# Patient Record
Sex: Female | Born: 1990
Health system: Southern US, Community
[De-identification: ages and names within clinical notes are randomized; demographics above are authoritative.]

## PROBLEM LIST (undated history)

## (undated) ENCOUNTER — Inpatient Hospital Stay (HOSPITAL_COMMUNITY): Payer: Self-pay

## (undated) DIAGNOSIS — E039 Hypothyroidism, unspecified: Secondary | ICD-10-CM

## (undated) DIAGNOSIS — M2241 Chondromalacia patellae, right knee: Secondary | ICD-10-CM

## (undated) DIAGNOSIS — K219 Gastro-esophageal reflux disease without esophagitis: Secondary | ICD-10-CM

## (undated) DIAGNOSIS — M79A29 Nontraumatic compartment syndrome of unspecified lower extremity: Secondary | ICD-10-CM

## (undated) DIAGNOSIS — G43909 Migraine, unspecified, not intractable, without status migrainosus: Secondary | ICD-10-CM

## (undated) DIAGNOSIS — N854 Malposition of uterus: Secondary | ICD-10-CM

## (undated) HISTORY — DX: Migraine, unspecified, not intractable, without status migrainosus: G43.909

## (undated) HISTORY — DX: Hypothyroidism, unspecified: E03.9

## (undated) HISTORY — DX: Malposition of uterus: N85.4

## (undated) HISTORY — PX: KNEE SURGERY: SHX244

## (undated) HISTORY — DX: Gastro-esophageal reflux disease without esophagitis: K21.9

---
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
## 2012-02-08 ENCOUNTER — Other Ambulatory Visit: Payer: Self-pay | Admitting: Family

## 2012-11-12 ENCOUNTER — Ambulatory Visit (INDEPENDENT_AMBULATORY_CARE_PROVIDER_SITE_OTHER): Payer: 59 | Admitting: Family Medicine

## 2012-11-12 VITALS — BP 115/75 | HR 66 | Temp 98.0°F | Resp 18 | Wt 161.0 lb

## 2012-11-12 DIAGNOSIS — G43909 Migraine, unspecified, not intractable, without status migrainosus: Secondary | ICD-10-CM

## 2012-11-12 DIAGNOSIS — R200 Anesthesia of skin: Secondary | ICD-10-CM

## 2012-11-12 DIAGNOSIS — R209 Unspecified disturbances of skin sensation: Secondary | ICD-10-CM

## 2012-11-12 DIAGNOSIS — M7989 Other specified soft tissue disorders: Secondary | ICD-10-CM

## 2012-11-12 LAB — POCT CBC
Hemoglobin: 15.8 g/dL (ref 12.2–16.2)
Lymph, poc: 1.7 (ref 0.6–3.4)
MCH, POC: 30.9 pg (ref 27–31.2)
MCHC: 32.7 g/dL (ref 31.8–35.4)
MID (cbc): 0.5 (ref 0–0.9)
MPV: 9.4 fL (ref 0–99.8)
POC LYMPH PERCENT: 21.7 %L (ref 10–50)
POC MID %: 6.6 %M (ref 0–12)
Platelet Count, POC: 297 10*3/uL (ref 142–424)
WBC: 7.8 10*3/uL (ref 4.6–10.2)

## 2012-11-12 MED ORDER — KETOROLAC TROMETHAMINE 60 MG/2ML IM SOLN
60.0000 mg | Freq: Once | INTRAMUSCULAR | Status: AC
  Administered 2012-11-12: 60 mg via INTRAMUSCULAR

## 2012-11-12 NOTE — Patient Instructions (Addendum)
You should receive a call or letter about your lab results within the next week to 10 days.  Continue same migraine meds, but we will refer you to another neurologist for 2nd opinion on medicine for migraine and headaches as well as to discuss the tingling in your hands.  Return to the clinic or go to the nearest emergency room if any of your symptoms worsen or new symptoms occur. If hand swelling returns - return to clinic for recheck.   Migraine Headache A migraine headache is an intense, throbbing pain on one or both sides of your head. A migraine can last for 30 minutes to several hours. CAUSES  The exact cause of a migraine headache is not always known. However, a migraine may be caused when nerves in the brain become irritated and release chemicals that cause inflammation. This causes pain. SYMPTOMS  Pain on one or both sides of your head.  Pulsating or throbbing pain.  Severe pain that prevents daily activities.  Pain that is aggravated by any physical activity.  Nausea, vomiting, or both.  Dizziness.  Pain with exposure to bright lights, loud noises, or activity.  General sensitivity to bright lights, loud noises, or smells. Before you get a migraine, you may get warning signs that a migraine is coming (aura). An aura may include:  Seeing flashing lights.  Seeing bright spots, halos, or zig-zag lines.  Having tunnel vision or blurred vision.  Having feelings of numbness or tingling.  Having trouble talking.  Having muscle weakness. MIGRAINE TRIGGERS  Alcohol.  Smoking.  Stress.  Menstruation.  Aged cheeses.  Foods or drinks that contain nitrates, glutamate, aspartame, or tyramine.  Lack of sleep.  Chocolate.  Caffeine.  Hunger.  Physical exertion.  Fatigue.  Medicines used to treat chest pain (nitroglycerine), birth control pills, estrogen, and some blood pressure medicines. DIAGNOSIS  A migraine headache is often diagnosed based  on:  Symptoms.  Physical examination.  A CT scan or MRI of your head. TREATMENT Medicines may be given for pain and nausea. Medicines can also be given to help prevent recurrent migraines.  HOME CARE INSTRUCTIONS  Only take over-the-counter or prescription medicines for pain or discomfort as directed by your caregiver. The use of long-term narcotics is not recommended.  Lie down in a dark, quiet room when you have a migraine.  Keep a journal to find out what may trigger your migraine headaches. For example, write down:  What you eat and drink.  How much sleep you get.  Any change to your diet or medicines.  Limit alcohol consumption.  Quit smoking if you smoke.  Get 7 to 9 hours of sleep, or as recommended by your caregiver.  Limit stress.  Keep lights dim if bright lights bother you and make your migraines worse. SEEK IMMEDIATE MEDICAL CARE IF:   Your migraine becomes severe.  You have a fever.  You have a stiff neck.  You have vision loss.  You have muscular weakness or loss of muscle control.  You start losing your balance or have trouble walking.  You feel faint or pass out.  You have severe symptoms that are different from your first symptoms. MAKE SURE YOU:   Understand these instructions.  Will watch your condition.  Will get help right away if you are not doing well or get worse. Document Released: 12/26/2004 Document Revised: 03/20/2011 Document Reviewed: 12/16/2010 [facility] Patient Information 2014 [place], [place].

## 2012-11-12 NOTE — Progress Notes (Addendum)
Subjective:  This chart was scribed for Jackie Staggers, MD by Jackie [name], ED scribe.  This patient was seen in room Room 12 and the patient's care was started at 2:45 PM.   Patient ID: Jackie [name], female    DOB: 02/16/1990, 22 y.o.   MRN: [id number]  Chief Complaint  Patient presents with  . Migraine  . hand swelling  . Dizziness    HPI  Jackie [name] is a 22 y.o. female PCP: Jackie Found, MD Neurologist: [facility] Neale [name] at [facility]  Pt with h/o migraines presents with a persistent migraine headache onset 2 days ago.  Headache is associated with nausea and vomiting.  She states it is localized to the left side of her head and the back right whereas it is normally "the opposite."  It is otherwise typical of her past migraines except that it has not been relieved by Flexeril and Phenergan which she normally takes whenever she has a migraine.  She took one 25mg  Phenergan tablet last night but has been nauseated and vomited 2 times today.  She denies visual changes, photophobia, or fever.  Pt has had persistent headaches successfully treated with Toradol in the past.  Pt also takes Topamax 100mg  daily and complains of associated side effects.  She states that she was taking Imipramine 400mg  q.d. which was providing relief but she then began having breakthrough headaches and was switched to Topamax 5 months ago.  Since then she states she has had dizziness and numbness to her face and toes.  She states she has told Dr. Neale [name] but he has continued to increase her Topamax dosage.  She has not been seen for her migraines at any other neurology or headache clinic.  She also complains of left hand swelling which began today around 12 PM and has since resolved.  She denies injury or holding her hand in an unusual position.  She denies swelling to any other area.  . Pt reports a h/o anxiety which she states has been worse since she was placed on Topamax.  She has never  been on anxiety medication.  She also admits to remote h/o panic attacks "when I was younger if I got claustrophobic."  She denies any more recent panic attacks.     Patient Active Problem List   Diagnosis Date Noted  . ABDOMINAL PAIN-RUQ 12/23/2009   Past Medical History  Diagnosis Date  . Migraines    Past Surgical History  Procedure Laterality Date  . Left knee surgery     No Known Allergies Prior to Admission medications   Medication Sig Start Date End Date Taking? Authorizing Provider  cyclobenzaprine (FLEXERIL) 10 MG tablet Take 10 mg by mouth 3 (three) times daily as needed for muscle spasms.   Yes Historical Provider, MD  promethazine (PHENERGAN) 25 MG tablet Take 25 mg by mouth every 6 (six) hours as needed for nausea or vomiting.   Yes Historical Provider, MD  topiramate (TOPAMAX) 100 MG tablet Take 100 mg by mouth 2 (two) times daily.   Yes Historical Provider, MD   History   Social History  . Marital Status: Single    Spouse Name: N/A    Number of Children: N/A  . Years of Education: N/A   Occupational History  . Not on file.   Social History Main Topics  . Smoking status: Never Smoker   . Smokeless tobacco: Not on file  . Alcohol Use: No  . Drug Use: No  .  Sexual Activity: Not on file   Other Topics Concern  . Not on file   Social History Narrative  . No narrative on file     Review of Systems  Constitutional: Negative for fever.  Eyes: Negative for photophobia and visual disturbance.  Gastrointestinal: Positive for nausea and vomiting.  Musculoskeletal:       Left hand swelling  Neurological: Positive for dizziness, numbness and headaches.       Objective:   Physical Exam  Nursing note and vitals reviewed. Constitutional: She is oriented to person, place, and time. She appears well-developed and well-nourished. No distress.  HENT:  Head: Normocephalic and atraumatic.  Right Ear: Tympanic membrane and ear canal normal.  Left Ear: Tympanic  membrane and ear canal normal.  Mouth/Throat: Oropharynx is clear and moist. No oropharyngeal exudate.  Eyes: Conjunctivae and EOM are normal. Pupils are equal, round, and reactive to light. Right eye exhibits no nystagmus. Left eye exhibits no nystagmus.  Neck: Normal range of motion. Neck supple. No tracheal deviation present.  No apparent thyroid nodules  Cardiovascular: Normal rate, regular rhythm and normal heart sounds.   No murmur heard. Pulmonary/Chest: Effort normal and breath sounds normal. No respiratory distress. She has no wheezes. She has no rales.  Musculoskeletal: Normal range of motion.  Lymphadenopathy:    She has no cervical adenopathy.  Neurological: She is alert and oriented to person, place, and time. She displays a negative Romberg sign. Coordination and gait normal.  Heel-to-toe normal Finger-nose-finger normal bilaterally  Skin: Skin is warm and dry.  Psychiatric: She has a normal mood and affect. Her behavior is normal.  nonfocal exam.  Appears overall comfortable in office.    Filed Vitals:   11/12/12 1250  BP: 115/75  Pulse: 66  Temp: 98 F (36.7 C)  TempSrc: Oral  Resp: 18  Weight: 161 lb (73.029 kg)  SpO2: 99%   Results for orders placed in visit on 11/12/12  POCT CBC      Result Value Range   WBC 7.8  4.6 - 10.2 K/uL   Lymph, poc 1.7  0.6 - 3.4   POC LYMPH PERCENT 21.7  10 - 50 %L   MID (cbc) 0.5  0 - 0.9   POC MID % 6.6  0 - 12 %M   POC Granulocyte 5.6  2 - 6.9   Granulocyte percent 71.7  37 - 80 %G   RBC 5.11  4.04 - 5.48 M/uL   Hemoglobin 15.8  12.2 - 16.2 g/dL   HCT, POC 16.1 (*) 09.6 - 47.9 %   MCV 94.5  80 - 97 fL   MCH, POC 30.9  27 - 31.2 pg   MCHC 32.7  31.8 - 35.4 g/dL   RDW, POC 04.5     Platelet Count, POC 297  142 - 424 K/uL   MPV 9.4  0 - 99.8 fL   Toradol 60mg  IM given in office.     Assessment & Plan:   Jackie [name] is a 22 y.o. female Numbness and tingling - Plan: POCT CBC, TSH, Vitamin B12, Ambulatory referral  to Neurology  Migraine headache - Plan: POCT CBC, TSH, Vitamin B12, ketorolac (TORADOL) injection 60 mg, Ambulatory referral to Neurology  Hand swelling - Plan: POCT CBC, TSH, Vitamin B12  Headache - typical of migraines with exception that on opposite side of head, and not relieved with phenergan and flexeril.  nonfocal exam. Discussed change in maintenance med as possible numbness/tingling side effects  from Topamax (mentioned SSRI as possible underlying anxiety), but ultimately decided on 2nd opinion with [facility] to discuss meds.  Will check TSH, B12 level to R/O secondary cause of dysesthesias (FH of thyroid disease in mom). If any increase/change/worseing of HA or dysesthesias prior to Neuro eval - rtc or ER. TOradol given in office, continue phenergan and flexeril - can start this afternoon and note given for work.   L hand swelling - earlier today for few hours.  NKI or inciting event, but not swollen at time of evaluation.  rtc or ER if any recurrence, but does not appear to be allergic reaction or angioedema at this point.    Meds ordered this encounter  Medications  . topiramate (TOPAMAX) 100 MG tablet    Sig: Take 100 mg by mouth 2 (two) times daily.  . cyclobenzaprine (FLEXERIL) 10 MG tablet    Sig: Take 10 mg by mouth 3 (three) times daily as needed for muscle spasms.  . promethazine (PHENERGAN) 25 MG tablet    Sig: Take 25 mg by mouth every 6 (six) hours as needed for nausea or vomiting.  Marland Kitchen ketorolac (TORADOL) injection 60 mg    Sig:    Patient Instructions  You should receive a call or letter about your lab results within the next week to 10 days.  Continue same migraine meds, but we will refer you to another neurologist for 2nd opinion on medicine for migraine and headaches as well as to discuss the tingling in your hands.  Return to the clinic or go to the nearest emergency room if any of your symptoms worsen or new symptoms occur. If hand swelling returns -  return to clinic for recheck.   Migraine Headache A migraine headache is an intense, throbbing pain on one or both sides of your head. A migraine can last for 30 minutes to several hours. CAUSES  The exact cause of a migraine headache is not always known. However, a migraine may be caused when nerves in the brain become irritated and release chemicals that cause inflammation. This causes pain. SYMPTOMS  Pain on one or both sides of your head.  Pulsating or throbbing pain.  Severe pain that prevents daily activities.  Pain that is aggravated by any physical activity.  Nausea, vomiting, or both.  Dizziness.  Pain with exposure to bright lights, loud noises, or activity.  General sensitivity to bright lights, loud noises, or smells. Before you get a migraine, you may get warning signs that a migraine is coming (aura). An aura may include:  Seeing flashing lights.  Seeing bright spots, halos, or zig-zag lines.  Having tunnel vision or blurred vision.  Having feelings of numbness or tingling.  Having trouble talking.  Having muscle weakness. MIGRAINE TRIGGERS  Alcohol.  Smoking.  Stress.  Menstruation.  Aged cheeses.  Foods or drinks that contain nitrates, glutamate, aspartame, or tyramine.  Lack of sleep.  Chocolate.  Caffeine.  Hunger.  Physical exertion.  Fatigue.  Medicines used to treat chest pain (nitroglycerine), birth control pills, estrogen, and some blood pressure medicines. DIAGNOSIS  A migraine headache is often diagnosed based on:  Symptoms.  Physical examination.  A CT scan or MRI of your head. TREATMENT Medicines may be given for pain and nausea. Medicines can also be given to help prevent recurrent migraines.  HOME CARE INSTRUCTIONS  Only take over-the-counter or prescription medicines for pain or discomfort as directed by your caregiver. The use of long-term narcotics is not recommended.  Lie down in a dark, quiet room when you  have a migraine.  Keep a journal to find out what may trigger your migraine headaches. For example, write down:  What you eat and drink.  How much sleep you get.  Any change to your diet or medicines.  Limit alcohol consumption.  Quit smoking if you smoke.  Get 7 to 9 hours of sleep, or as recommended by your caregiver.  Limit stress.  Keep lights dim if bright lights bother you and make your migraines worse. SEEK IMMEDIATE MEDICAL CARE IF:   Your migraine becomes severe.  You have a fever.  You have a stiff neck.  You have vision loss.  You have muscular weakness or loss of muscle control.  You start losing your balance or have trouble walking.  You feel faint or pass out.  You have severe symptoms that are different from your first symptoms. MAKE SURE YOU:   Understand these instructions.  Will watch your condition.  Will get help right away if you are not doing well or get worse. Document Released: 12/26/2004 Document Revised: 03/20/2011 Document Reviewed: 12/16/2010 [facility] Patient Information 2014 [place], [place].     I personally performed the services described in this documentation, which was scribed in my presence. The recorded information has been reviewed and considered, and addended by me as needed.

## 2012-11-14 ENCOUNTER — Encounter: Payer: Self-pay | Admitting: Neurology

## 2012-11-14 ENCOUNTER — Ambulatory Visit (INDEPENDENT_AMBULATORY_CARE_PROVIDER_SITE_OTHER): Payer: 59 | Admitting: Neurology

## 2012-11-14 VITALS — BP 106/68 | HR 82 | Ht 65.25 in | Wt 157.0 lb

## 2012-11-14 DIAGNOSIS — G43909 Migraine, unspecified, not intractable, without status migrainosus: Secondary | ICD-10-CM

## 2012-11-14 MED ORDER — ZONISAMIDE 50 MG PO CAPS: 50.0000 mg | ORAL_CAPSULE | Freq: Two times a day (BID) | ORAL | Status: DC

## 2012-11-14 MED ORDER — RIZATRIPTAN BENZOATE 5 MG PO TBDP: 5.0000 mg | ORAL_TABLET | ORAL | Status: DC | PRN

## 2012-11-14 NOTE — Progress Notes (Signed)
[facility]  PATIENT: Jackie [name] DOB: 14-Jan-1990  HISTORICAL  [name] is a 22 years old right-handed Caucasian female, she is referred by her primary care physician Dr. Merri Brunette for evaluation of migraine headaches  She had long-standing history of migraines, since age 22, has seen different treatment in the past, including [facility], Dr. Neale Burly, she was taking imipramine for 3 years, initially it was helping her, but later it become ineffective,  About 3 months ago, July 28, 2012, she was put on Topamax, she is currently taking 100 mg every day, she did not see significant improvement of her headaches, complains the side effect of numbness tingling of her face, and in her extremities.   July 2014, she has typical migraines about couple times in a month, now she has almost daily headaches, she complains of pressure, dull, achy pain at her left frontal, occipital, parietal region, daily basis, she has been taking Flexeril, Phenergan as needed for headaches.  She denies visual loss, she denies dysarthria, she denies lateralized motor or sensory deficit   REVIEW OF SYSTEMS: Full 14 system review of systems performed and notable only for blurred vision, eye pain, headaches, numbness, dizziness  ALLERGIES: No Known Allergies  HOME MEDICATIONS: Outpatient Prescriptions Prior to Visit  Medication Sig Dispense Refill  . cyclobenzaprine (FLEXERIL) 10 MG tablet Take 10 mg by mouth 3 (three) times daily as needed for muscle spasms.      . promethazine (PHENERGAN) 25 MG tablet Take 25 mg by mouth every 6 (six) hours as needed for nausea or vomiting.      . topiramate (TOPAMAX) 100 MG tablet Take 100 mg by mouth 2 (two) times daily.       PAST MEDICAL HISTORY: Past Medical History  Diagnosis Date  . Migraines     PAST SURGICAL HISTORY: Past Surgical History  Procedure Laterality Date  . Left knee surgery      FAMILY HISTORY: Family History    Problem Relation Age of Onset  . Thyroid disease Mother   . Diabetes Paternal Grandmother     SOCIAL HISTORY:  History   Social History  . Marital Status: Single    Spouse Name: N/A    Number of Children: 0  . Years of Education: 14   Occupational History  . MINOR    Social History Main Topics  . Smoking status: Never Smoker   . Smokeless tobacco: Never Used  . Alcohol Use: No  . Drug Use: No  . Sexual Activity: Not on file   Other Topics Concern  . Not on file   Social History Narrative   Patient is single and lives with a roommate.   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   Patient has a college education.   Patient is right-handed.   Patient drinks very little caffeine.     PHYSICAL EXAM   Filed Vitals:   11/14/12 0854  BP: 106/68  Pulse: 82  Height: 5' 5.25" (1.657 m)  Weight: 157 lb (71.215 kg)   Body mass index is 25.94 kg/(m^2).   Generalized: In no acute distress  Neck: Supple, no carotid bruits   Cardiac: Regular rate rhythm  Pulmonary: Clear to auscultation bilaterally  Musculoskeletal: No deformity  Neurological examination  Mentation: Alert oriented to time, place, history taking, and causual conversation  Cranial nerve II-XII: Pupils were equal round reactive to light extraocular movements were full, visual field were full on confrontational test. facial sensation and strength  were normal. hearing was intact to finger rubbing bilaterally. Uvula tongue midline.  head turning and shoulder shrug and were normal and symmetric.Tongue protrusion into cheek strength was normal.  Motor: normal tone, bulk and strength.  Sensory: Intact to fine touch, pinprick, preserved vibratory sensation, and proprioception at toes.  Coordination: Normal finger to nose, heel-to-shin bilaterally there was no truncal ataxia  Gait: Rising up from seated position without assistance, normal stance, without trunk ataxia, moderate stride, good arm swing, smooth turning,  able to perform tiptoe, and heel walking without difficulty.   Romberg signs: Negative  Deep tendon reflexes: Brachioradialis 2/2, biceps 2/2, triceps 2/2, patellar 2/2, Achilles 2/2, plantar responses were flexor bilaterally.   DIAGNOSTIC DATA (LABS, IMAGING, TESTING) - I reviewed patient records, labs, notes, testing and imaging myself where available.  Lab Results  Component Value Date   WBC 7.8 11/12/2012   HGB 15.8 11/12/2012   HCT 48.3* 11/12/2012   MCV 94.5 11/12/2012   PLT 236 12/21/2009      Component Value Date/Time   NA 137 12/21/2009 1440   K 4.3 12/21/2009 1440   CL 107 12/21/2009 1440   CO2 26 12/21/2009 1440   GLUCOSE 90 12/21/2009 1440   BUN 7 12/21/2009 1440   CREATININE 0.72 12/21/2009 1440   CALCIUM 9.5 12/21/2009 1440   PROT 7.5 12/21/2009 1440   ALBUMIN 4.2 12/21/2009 1440   AST 15 12/21/2009 1440   ALT 12 12/21/2009 1440   ALKPHOS 83 12/21/2009 1440   BILITOT 0.6 12/21/2009 1440   GFRNONAA NOT CALCULATED 12/21/2009 1440   GFRAA  Value: NOT CALCULATED        The eGFR has been calculated using the MDRD equation. This calculation has not been validated in all clinical situations. eGFR's persistently <60 mL/min signify possible Chronic Kidney Disease. 12/21/2009 1440   No results found for this basename: CHOL, HDL, LDLCALC, LDLDIRECT, TRIG, CHOLHDL   No results found for this basename: HGBA1C   Lab Results  Component Value Date   VITAMINB12 663 11/12/2012   Lab Results  Component Value Date   TSH 2.353 11/12/2012    ASSESSMENT AND PLAN  22 years old right-handed Caucasian female, with a long-standing history of migraine, now with frequent migraine headaches again, normal neurological examinations she has tried and failed multiple preventive medications including Topamax, imipramine,   1.  I will try Zonegran 50 mg twice a day,,   2  Maxalt 50 mg as needed for migraine abortion 3.  Return to clinic in 3 months    Levert Feinstein, M.D. Ph.D.  [facility] [address], [address] [place], [place] [address] [phone]

## 2012-11-18 ENCOUNTER — Encounter: Payer: Self-pay | Admitting: Family Medicine

## 2013-02-14 ENCOUNTER — Encounter: Payer: Self-pay | Admitting: Nurse Practitioner

## 2013-02-14 ENCOUNTER — Encounter (INDEPENDENT_AMBULATORY_CARE_PROVIDER_SITE_OTHER): Payer: Self-pay

## 2013-02-14 ENCOUNTER — Ambulatory Visit (INDEPENDENT_AMBULATORY_CARE_PROVIDER_SITE_OTHER): Payer: 59 | Admitting: Nurse Practitioner

## 2013-02-14 VITALS — BP 119/71 | HR 81 | Ht 67.0 in | Wt 164.0 lb

## 2013-02-14 DIAGNOSIS — G43909 Migraine, unspecified, not intractable, without status migrainosus: Secondary | ICD-10-CM

## 2013-02-14 NOTE — Progress Notes (Signed)
[facility]  PATIENT: Jackie [name] DOB: 11-15-1990   REASON FOR VISIT: Followup for migraine   HISTORY OF PRESENT ILLNESS: Jackie [name], 23 year old female returns for followup. She was initially evaluated for migraine Dr. Terrace ArabiaYan 11/14/2012. She was having daily headaches at that time. She was placed on Zonegran 50 mg twice a day along with Maxalt when necessary. She claims she has had 2 headaches since that time. She denies any side effects to the medication. She denies any motor or sensory deficits with her headaches, no visual loss no speech problems. She returns for reevaluation. She is not aware of any migraine triggers    HISTORY:She had long-standing history of migraines, since age 89, has seen different treatment in the past, including [facility], Dr. Neale BurlyFreeman, she was taking imipramine for 3 years, initially it was helping her, but later it become ineffective,  About 3 months ago, July 2014, she was put on Topamax, she is currently taking 100 mg every day, she did not see significant improvement of her headaches, complains the side effect of numbness tingling of her face, and in her extremities.  July 2014, she has typical migraines about couple times in a month, now she has almost daily headaches, she complains of pressure, dull, achy pain at her left frontal, occipital, parietal region, daily basis, she has been taking Flexeril, Phenergan as needed for headaches.  She denies visual loss, she denies dysarthria, she denies lateralized motor or sensory deficit      REVIEW OF SYSTEMS: Full 14 system review of systems performed and notable only for those listed, all others are neg:  Constitutional: N/A  Cardiovascular: N/A  Ear/Nose/Throat: N/A  Skin: N/A  Eyes: N/A  Respiratory: N/A  Gastroitestinal: N/A  Hematology/Lymphatic: N/A  Endocrine: N/A Musculoskeletal:N/A  Allergy/Immunology: N/A  Neurological: N/A Psychiatric: N/A   ALLERGIES: No  Known Allergies  HOME MEDICATIONS: Outpatient Prescriptions Prior to Visit  Medication Sig Dispense Refill  . cyclobenzaprine (FLEXERIL) 10 MG tablet Take 10 mg by mouth 3 (three) times daily as needed for muscle spasms.      . promethazine (PHENERGAN) 25 MG tablet Take 25 mg by mouth every 6 (six) hours as needed for nausea or vomiting.      . rizatriptan (MAXALT-MLT) 5 MG disintegrating tablet Take 1 tablet (5 mg total) by mouth as needed for migraine. May repeat in 2 hours if needed  15 tablet  6  . zonisamide (ZONEGRAN) 50 MG capsule Take 1 capsule (50 mg total) by mouth 2 (two) times daily.  60 capsule  6   No facility-administered medications prior to visit.    PAST MEDICAL HISTORY: Past Medical History  Diagnosis Date  . Migraines     PAST SURGICAL HISTORY: Past Surgical History  Procedure Laterality Date  . Left knee surgery      FAMILY HISTORY: Family History  Problem Relation Age of Onset  . Thyroid disease Mother   . Diabetes Paternal Grandmother     SOCIAL HISTORY: History   Social History  . Marital Status: Single    Spouse Name: N/A    Number of Children: 0  . Years of Education: 14   Occupational History  . MINOR    Social History Main Topics  . Smoking status: Never Smoker   . Smokeless tobacco: Never Used  . Alcohol Use: No  . Drug Use: No  . Sexual Activity: Not on file   Other Topics Concern  . Not on file  Social History Narrative   Patient is single and lives with a roommate.   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   Patient has a college education.   Patient is right-handed.   Patient drinks very little caffeine.     PHYSICAL EXAM  Filed Vitals:   02/14/13 0907  BP: 119/71  Pulse: 81  Height: 5\' 7"  (1.702 m)  Weight: 164 lb (74.39 kg)   Body mass index is 25.68 kg/(m^2).  Generalized: Well developed, in no acute distress  Head: normocephalic and atraumatic,. Oropharynx benign  Neck: Supple, no carotid bruits  Cardiac:  Regular rate rhythm, no murmur  Musculoskeletal: No deformity   Neurological examination   Mentation: Alert oriented to time, place, history taking. Follows all commands speech and language fluent  Cranial nerve II-XII: Pupils were equal round reactive to light extraocular movements were full, visual field were full on confrontational test. Facial sensation and strength were normal. hearing was intact to finger rubbing bilaterally. Uvula tongue midline. head turning and shoulder shrug were normal and symmetric.Tongue protrusion into cheek strength was normal. Motor: normal bulk and tone, full strength in the BUE, BLE, fine finger movements normal, no pronator drift. No focal weakness Coordination: finger-nose-finger, heel-to-shin bilaterally, no dysmetria Reflexes: Brachioradialis 2/2, biceps 2/2, triceps 2/2, patellar 2/2, Achilles 2/2, plantar responses were flexor bilaterally. Gait and Station: Rising up from seated position without assistance, normal stance,  moderate stride, good arm swing, smooth turning, able to perform tiptoe, and heel walking without difficulty. Tandem gait is steady  DIAGNOSTIC DATA (LABS, IMAGING, TESTING) - I reviewed patient records, labs, notes, testing and imaging myself where available.  Lab Results  Component Value Date   WBC 7.8 11/12/2012   HGB 15.8 11/12/2012   HCT 48.3* 11/12/2012   MCV 94.5 11/12/2012   PLT 236 12/21/2009     Lab Results  Component Value Date   VITAMINB12 663 11/12/2012   Lab Results  Component Value Date   TSH 2.353 11/12/2012      ASSESSMENT AND PLAN  23 y.o. year old female  has a past medical history of Migraines. here in followup. She has been placed on Zonegran 50 mg twice daily and her headaches have gone from daily to 2 headaches in the last 2 months. She takes Maxalt acutely  Continue Zonegran 50mg  twice daily Maxalt as needed acutely Given list of common foods and other migraine triggers with additional discussion  pertaining to foods.She will eliminate one at a time. F/U 6 months Nilda Riggs, [facility], [facility], APRN  [facility] [address], [address] [place], [place] [address] [phone]

## 2013-02-14 NOTE — Patient Instructions (Signed)
Continue Zonegran 50mg  twice daily Maxalt as needed acutely Given list of common foods and other migraine triggers F/U 6 months

## 2013-04-17 ENCOUNTER — Other Ambulatory Visit (HOSPITAL_COMMUNITY)
Admission: RE | Admit: 2013-04-17 | Discharge: 2013-04-17 | Disposition: A | Discharge status: Home / Self Care | Payer: 59 | Source: Ambulatory Visit | Attending: Nurse Practitioner | Admitting: Nurse Practitioner

## 2013-04-17 ENCOUNTER — Other Ambulatory Visit: Payer: Self-pay | Admitting: Nurse Practitioner

## 2013-04-17 DIAGNOSIS — Z113 Encounter for screening for infections with a predominantly sexual mode of transmission: Secondary | ICD-10-CM | POA: Insufficient documentation

## 2013-04-17 DIAGNOSIS — Z124 Encounter for screening for malignant neoplasm of cervix: Secondary | ICD-10-CM | POA: Insufficient documentation

## 2013-05-08 ENCOUNTER — Ambulatory Visit (HOSPITAL_COMMUNITY)
Admission: RE | Admit: 2013-05-08 | Discharge: 2013-05-08 | Disposition: A | Discharge status: Home / Self Care | Payer: 59 | Source: Ambulatory Visit | Attending: Emergency Medicine | Admitting: Emergency Medicine

## 2013-05-08 ENCOUNTER — Telehealth: Payer: Self-pay

## 2013-05-08 ENCOUNTER — Other Ambulatory Visit: Payer: Self-pay | Admitting: Emergency Medicine

## 2013-05-08 ENCOUNTER — Ambulatory Visit (INDEPENDENT_AMBULATORY_CARE_PROVIDER_SITE_OTHER): Payer: 59 | Admitting: Emergency Medicine

## 2013-05-08 VITALS — BP 108/70 | HR 73 | Temp 98.1°F | Resp 16 | Ht 65.5 in | Wt 160.6 lb

## 2013-05-08 DIAGNOSIS — R1031 Right lower quadrant pain: Secondary | ICD-10-CM | POA: Diagnosis present

## 2013-05-08 DIAGNOSIS — N83209 Unspecified ovarian cyst, unspecified side: Secondary | ICD-10-CM | POA: Insufficient documentation

## 2013-05-08 LAB — POCT UA - MICROSCOPIC ONLY
BACTERIA, U MICROSCOPIC: NEGATIVE
CASTS, UR, LPF, POC: NEGATIVE
CRYSTALS, UR, HPF, POC: NEGATIVE
EPITHELIAL CELLS, URINE PER MICROSCOPY: NEGATIVE
RBC, URINE, MICROSCOPIC: NEGATIVE
Yeast, UA: NEGATIVE

## 2013-05-08 LAB — POCT CBC
GRANULOCYTE PERCENT: 72.9 % (ref 37–80)
HEMATOCRIT: 44.2 % (ref 37.7–47.9)
Hemoglobin: 14.5 g/dL (ref 12.2–16.2)
Lymph, poc: 1.4 (ref 0.6–3.4)
MCH: 30 pg (ref 27–31.2)
MCHC: 32.8 g/dL (ref 31.8–35.4)
MCV: 91.6 fL (ref 80–97)
MID (CBC): 0.5 (ref 0–0.9)
MPV: 8.7 fL (ref 0–99.8)
PLATELET COUNT, POC: 254 10*3/uL (ref 142–424)
POC Granulocyte: 5.1 (ref 2–6.9)
POC LYMPH %: 20.1 % (ref 10–50)
POC MID %: 7 %M (ref 0–12)
RBC: 4.83 M/uL (ref 4.04–5.48)
RDW, POC: 12.8 %
WBC: 7 10*3/uL (ref 4.6–10.2)

## 2013-05-08 LAB — POCT WET PREP WITH KOH
KOH PREP POC: NEGATIVE
Trichomonas, UA: NEGATIVE
YEAST WET PREP PER HPF POC: NEGATIVE

## 2013-05-08 LAB — POCT URINALYSIS DIPSTICK
Bilirubin, UA: NEGATIVE
Blood, UA: NEGATIVE
Glucose, UA: NEGATIVE
KETONES UA: NEGATIVE
Leukocytes, UA: NEGATIVE
Nitrite, UA: NEGATIVE
PH UA: 6.5
PROTEIN UA: NEGATIVE
SPEC GRAV UA: 1.025
UROBILINOGEN UA: 0.2

## 2013-05-08 LAB — POCT URINE PREGNANCY: Preg Test, Ur: NEGATIVE

## 2013-05-08 MED ORDER — ACETAMINOPHEN-CODEINE #3 300-30 MG PO TABS: 1.0000 | ORAL_TABLET | ORAL | Status: DC | PRN

## 2013-05-08 MED ORDER — IOHEXOL 300 MG/ML  SOLN
100.0000 mL | Freq: Once | INTRAMUSCULAR | Status: AC | PRN
  Administered 2013-05-08: 100 mL via INTRAVENOUS

## 2013-05-08 MED ORDER — IOHEXOL 300 MG/ML  SOLN
50.0000 mL | Freq: Once | INTRAMUSCULAR | Status: AC | PRN
  Administered 2013-05-08: 50 mL via ORAL

## 2013-05-08 NOTE — Telephone Encounter (Signed)
Pt's mother called back to check on results of CT. Advised per Dr Dareen PianoAnderson that pt does not have appendicitis. CT showed a complex ovarian cyst which can be further evaluated by US, which is why US is being done. Mother voiced understanding and will wait to hear about US results.

## 2013-05-08 NOTE — Progress Notes (Signed)
[facility] [address], [place] [address] [phone]  Date:  05/08/2013   Name:  Jackie [name]   DOB:  1990/10/04   MRN:  [id number]  PCP:  Allean FoundSMITH,CANDACE THIELE, MD    Chief Complaint: Abdominal Pain and Nausea   History of Present Illness:  Jackie [name] is a 23 y.o. very pleasant female patient who presents with the following:  Says well until last night when she lay down.  Felt a "tearing, ripping pain" in her RLQ.  Started in periumbilical area and moved to RLQ  Persists now.  Nauseated with no appetite for two days.  No fever or chills. Last BM two days.  Unsure LMP as uses IUD.  Had some jar tenderness with potholes while driving  Says has some dyspareunia due IUD.  Has reported to GYN who says is normal.  No GU symptoms or discharge.  No improvement with over the counter medications or other home remedies. Denies other complaint or health concern today.   Patient Active Problem List   Diagnosis Date Noted  . Migraine, unspecified, without mention of intractable migraine without mention of status migrainosus 11/14/2012  . ABDOMINAL PAIN-RUQ 12/23/2009    Past Medical History  Diagnosis Date  . Migraines     Past Surgical History  Procedure Laterality Date  . Left knee surgery      History  Substance Use Topics  . Smoking status: Never Smoker   . Smokeless tobacco: Never Used  . Alcohol Use: No    Family History  Problem Relation Age of Onset  . Thyroid disease Mother   . Diabetes Paternal Grandmother     Allergies  Allergen Reactions  . Topamax [Topiramate]     Numbness  on arms and face    Medication list has been reviewed and updated.  Current Outpatient Prescriptions on File Prior to Visit  Medication Sig Dispense Refill  . cyclobenzaprine (FLEXERIL) 10 MG tablet Take 10 mg by mouth 3 (three) times daily as needed for muscle spasms.      . promethazine (PHENERGAN) 25 MG tablet Take 25 mg by mouth every 6 (six) hours as  needed for nausea or vomiting.      . rizatriptan (MAXALT-MLT) 5 MG disintegrating tablet Take 1 tablet (5 mg total) by mouth as needed for migraine. May repeat in 2 hours if needed  15 tablet  6  . zonisamide (ZONEGRAN) 50 MG capsule Take 1 capsule (50 mg total) by mouth 2 (two) times daily.  60 capsule  6   No current facility-administered medications on file prior to visit.    Review of Systems:  As per HPI, otherwise negative.    Physical Examination: Filed Vitals:   05/08/13 0939  BP: 108/70  Pulse: 73  Temp: 98.1 F (36.7 C)  Resp: 16   Filed Vitals:   05/08/13 0939  Height: 5' 5.5" (1.664 m)  Weight: 160 lb 9.6 oz (72.848 kg)   Body mass index is 26.31 kg/(m^2). Ideal Body Weight: Weight in (lb) to have BMI = 25: 152.2  GEN: WDWN, NAD, Non-toxic, A & O x 3 HEENT: Atraumatic, Normocephalic. Neck supple. No masses, No LAD. Ears and Nose: No external deformity. CV: RRR, No M/G/R. No JVD. No thrill. No extra heart sounds. PULM: CTA B, no wheezes, crackles, rhonchi. No retractions. No resp. distress. No accessory muscle use. ABD: S, tender RLQ, ND, +BS. Direct and referred rebound. No HSM. Jar tender EXTR: No  c/c/e NEURO Normal gait.  PSYCH: Normally interactive. Conversant. Not depressed or anxious appearing.  Calm demeanor.  Physical Examination: Pelvic - normal external genitalia, vulva, vagina, cervix, uterus and right adnexal tenderness, exam chaperoned by Donnamarie PoagJeanne    Assessment and Plan: Appendicitis CT  Signed,  Phillips OdorJeffery Michele Judy, MD   Results for orders placed in visit on 05/08/13  POCT CBC      Result Value Ref Range   WBC 7.0  4.6 - 10.2 K/uL   Lymph, poc 1.4  0.6 - 3.4   POC LYMPH PERCENT 20.1  10 - 50 %L   MID (cbc) 0.5  0 - 0.9   POC MID % 7.0  0 - 12 %M   POC Granulocyte 5.1  2 - 6.9   Granulocyte percent 72.9  37 - 80 %G   RBC 4.83  4.04 - 5.48 M/uL   Hemoglobin 14.5  12.2 - 16.2 g/dL   HCT, POC 16.144.2  09.637.7 - 47.9 %   MCV 91.6  80 - 97 fL    MCH, POC 30.0  27 - 31.2 pg   MCHC 32.8  31.8 - 35.4 g/dL   RDW, POC 04.512.8     Platelet Count, POC 254  142 - 424 K/uL   MPV 8.7  0 - 99.8 fL  POCT UA - MICROSCOPIC ONLY      Result Value Ref Range   WBC, Ur, HPF, POC 0-2     RBC, urine, microscopic neg     Bacteria, U Microscopic neg     Mucus, UA postive     Epithelial cells, urine per micros neg     Crystals, Ur, HPF, POC neg     Casts, Ur, LPF, POC neg     Yeast, UA neg    POCT URINALYSIS DIPSTICK      Result Value Ref Range   Color, UA yellow     Clarity, UA clear     Glucose, UA neg     Bilirubin, UA neg     Ketones, UA neg     Spec Grav, UA 1.025     Blood, UA neg     pH, UA 6.5     Protein, UA neg     Urobilinogen, UA 0.2     Nitrite, UA neg     Leukocytes, UA Negative

## 2013-05-08 NOTE — Patient Instructions (Signed)
[facility] at [facility]iology for out patient CT

## 2013-05-09 LAB — GC/CHLAMYDIA PROBE AMP
CT Probe RNA: NEGATIVE
GC Probe RNA: NEGATIVE

## 2013-07-18 ENCOUNTER — Encounter: Payer: Self-pay | Admitting: Nurse Practitioner

## 2013-07-18 ENCOUNTER — Telehealth: Payer: Self-pay | Admitting: Nurse Practitioner

## 2013-07-18 NOTE — Telephone Encounter (Signed)
Spoke to patient regarding rescheduling 08/15/13 appointment per Carolyn's schedule, patient verbalized understanding, printed and mailed letter with new appointment time.

## 2013-07-22 ENCOUNTER — Encounter (HOSPITAL_BASED_OUTPATIENT_CLINIC_OR_DEPARTMENT_OTHER): Payer: Self-pay | Admitting: *Deleted

## 2013-07-23 ENCOUNTER — Other Ambulatory Visit: Payer: Self-pay | Admitting: Physician Assistant

## 2013-07-23 NOTE — H&P (Signed)
  [name] [address]   [address] [place], [place] [id number] [phone] A Division of [facility]  [name] Dempsey Knotek, M.D.   Robert A. Thurston HoleWainer, M.D.   Burnell BlanksW. Dan Caffrey, M.D.   Eulas PostJoshua P. Landau, M.D.   Lunette StandsAnna Voytek, M.D. Jewel Baizeimothy D. Eulah PontMurphy, M.D.  Buford DresserWesley R. Ibazebo, M.D.  Estell HarpinJames S. Kramer, M.D.    Melina Fiddlerebecca S. Bassett, M.D. Mary L. Isidoro DonningAnton, PA-C  Kirstin A. Shepperson, PA-C  Josh Running Springshadwell, PA-C GregoryBrandon Parry, [place]-C  RE: Jackie [name], Jackie [name]   [phone]      DOB: 02/18/1990 PROGRESS NOTE: 07-04-13 HPI: This is a 23 year old female who presents with left knee pain for the past four years or so.  She has previously been seen by Dr. Madelon Lipsaffrey who did an arthroscopic plica excision 07/12/2011.  She has had no relief of pain since the time of operative intervention. Of note, there was no previous injury.  The pain she is having is just progressively worsening.  The pain is located on the medial aspect of her left knee for which she describes a constant ache with occasional sharp shooting pains with pressure.  He does have some numbness which radiates to her left foot when standing.  She also admits to some instability.   She also admits to some locking, popping, catching as well as instability.  The pain is worse going up and down stairs as well as squatting and pivoting.  She has tried ice and Advil with mild relief of symptoms.    Past Medical, Family and Social History are reviewed in detail on patient questionnaire and signed. Review of Systems as detailed in HPI; all others reviewed and are negative.    EXAMINATION: Well-developed and well-nourished female in no acute distress.  Alert and oriented x 3.  Examination of her left knee reveals 10 degrees of extension lag.  Range of motion 10 to 100 degrees.  Negative Lachman's.  She does have exquisite tenderness to palpation of the medial joint line.  Positive medial McMurray.  Increased Q-ankle. Patella  apprehension as well as lateral tracking and tethering left greater than right.  She does have a 1+ effusion. Mild patella alta and mild patellofemoral crepitus noted.    X-RAYS: Previous images from [facility]gent [facility] from 07/01/2013 reveals borderline patella alta, otherwise no acute findings and well preserved joint space.   IMPRESSION: Patellofemoral syndrome with probable pseudo-locking vs. medial meniscus tear.    DISPOSITION: Although we feel as though this is really a result of patellofemoral syndrome, due to her mechanical symptoms and longevity of pain, we are going to order and MRI of her left knee to rule out medial meniscus tear.  We are hopeful that MRI is negative.  She is to followup with us after MRI and if it is negative for meniscal pathology, we will have her  go to therapy and work on [employer] strengthening.  We also discussed lateral retinacular release as well as Fulkerson procedure if all conservative measures fail.  We will see her after her MRI results are in,.  [name] Karolynn Infantino, M.D. Electronically verified by [name] Jaslyn Bansal, M.D. DFM(MLA):gde D 07-04-13              T 07-07-13

## 2013-07-24 ENCOUNTER — Encounter (HOSPITAL_BASED_OUTPATIENT_CLINIC_OR_DEPARTMENT_OTHER): Payer: Self-pay | Admitting: *Deleted

## 2013-07-24 ENCOUNTER — Encounter (HOSPITAL_BASED_OUTPATIENT_CLINIC_OR_DEPARTMENT_OTHER): Payer: BC Managed Care – PPO | Admitting: Anesthesiology

## 2013-07-24 ENCOUNTER — Ambulatory Visit (HOSPITAL_BASED_OUTPATIENT_CLINIC_OR_DEPARTMENT_OTHER): Payer: BC Managed Care – PPO | Admitting: Anesthesiology

## 2013-07-24 ENCOUNTER — Encounter (HOSPITAL_BASED_OUTPATIENT_CLINIC_OR_DEPARTMENT_OTHER)
Admission: RE | Disposition: A | Discharge status: Home / Self Care | Payer: Self-pay | Source: Ambulatory Visit | Attending: Orthopedic Surgery

## 2013-07-24 ENCOUNTER — Ambulatory Visit (HOSPITAL_BASED_OUTPATIENT_CLINIC_OR_DEPARTMENT_OTHER)
Admission: RE | Admit: 2013-07-24 | Discharge: 2013-07-24 | Disposition: A | Discharge status: Home / Self Care | Payer: BC Managed Care – PPO | Source: Ambulatory Visit | Attending: Orthopedic Surgery | Admitting: Orthopedic Surgery

## 2013-07-24 DIAGNOSIS — M222X9 Patellofemoral disorders, unspecified knee: Secondary | ICD-10-CM | POA: Diagnosis present

## 2013-07-24 DIAGNOSIS — M2240 Chondromalacia patellae, unspecified knee: Secondary | ICD-10-CM | POA: Insufficient documentation

## 2013-07-24 DIAGNOSIS — M6750 Plica syndrome, unspecified knee: Secondary | ICD-10-CM | POA: Insufficient documentation

## 2013-07-24 DIAGNOSIS — I10 Essential (primary) hypertension: Secondary | ICD-10-CM | POA: Diagnosis not present

## 2013-07-24 DIAGNOSIS — M238X9 Other internal derangements of unspecified knee: Secondary | ICD-10-CM | POA: Diagnosis not present

## 2013-07-24 HISTORY — PX: KNEE ARTHROSCOPY WITH LATERAL RELEASE: SHX5649

## 2013-07-24 LAB — POCT HEMOGLOBIN-HEMACUE: Hemoglobin: 14.7 g/dL (ref 12.0–15.0)

## 2013-07-24 SURGERY — ARTHROSCOPY, KNEE, WITH LATERAL RETINACULUM RELEASE
Anesthesia: Regional | Site: Knee | Laterality: Left

## 2013-07-24 MED ORDER — OXYCODONE-ACETAMINOPHEN 5-325 MG PO TABS
1.0000 | ORAL_TABLET | ORAL | Status: DC | PRN
  Administered 2013-07-24: 2 via ORAL
  Filled 2013-07-24: qty 2

## 2013-07-24 MED ORDER — METOCLOPRAMIDE HCL 5 MG/ML IJ SOLN: 10.0000 mg | Freq: Once | INTRAMUSCULAR | Status: DC | PRN

## 2013-07-24 MED ORDER — MIDAZOLAM HCL 2 MG/2ML IJ SOLN
INTRAMUSCULAR | Status: AC
  Filled 2013-07-24: qty 2

## 2013-07-24 MED ORDER — METHOCARBAMOL 500 MG PO TABS
500.0000 mg | ORAL_TABLET | Freq: Four times a day (QID) | ORAL | Status: DC | PRN
  Administered 2013-07-24: 500 mg via ORAL
  Filled 2013-07-24: qty 1

## 2013-07-24 MED ORDER — METOCLOPRAMIDE HCL 5 MG/ML IJ SOLN: 5.0000 mg | Freq: Three times a day (TID) | INTRAMUSCULAR | Status: DC | PRN

## 2013-07-24 MED ORDER — ONDANSETRON HCL 4 MG/2ML IJ SOLN
INTRAMUSCULAR | Status: DC | PRN
  Administered 2013-07-24: 4 mg via INTRAVENOUS

## 2013-07-24 MED ORDER — CHLORHEXIDINE GLUCONATE 4 % EX LIQD: 60.0000 mL | Freq: Once | CUTANEOUS | Status: DC

## 2013-07-24 MED ORDER — OXYCODONE HCL 5 MG/5ML PO SOLN: 5.0000 mg | Freq: Once | ORAL | Status: DC | PRN

## 2013-07-24 MED ORDER — OXYCODONE HCL 5 MG PO TABS: 5.0000 mg | ORAL_TABLET | Freq: Once | ORAL | Status: DC | PRN

## 2013-07-24 MED ORDER — ONDANSETRON HCL 4 MG PO TABS: 4.0000 mg | ORAL_TABLET | Freq: Four times a day (QID) | ORAL | Status: DC | PRN

## 2013-07-24 MED ORDER — ONDANSETRON HCL 4 MG PO TABS: 4.0000 mg | ORAL_TABLET | Freq: Three times a day (TID) | ORAL | Status: DC | PRN

## 2013-07-24 MED ORDER — HYDROMORPHONE HCL PF 1 MG/ML IJ SOLN: 0.5000 mg | INTRAMUSCULAR | Status: DC | PRN

## 2013-07-24 MED ORDER — PROPOFOL 10 MG/ML IV BOLUS
INTRAVENOUS | Status: AC
  Filled 2013-07-24: qty 20

## 2013-07-24 MED ORDER — RIZATRIPTAN BENZOATE 5 MG PO TBDP: 5.0000 mg | ORAL_TABLET | ORAL | Status: DC | PRN

## 2013-07-24 MED ORDER — METHOCARBAMOL 1000 MG/10ML IJ SOLN: 500.0000 mg | Freq: Four times a day (QID) | INTRAVENOUS | Status: DC | PRN

## 2013-07-24 MED ORDER — FENTANYL CITRATE 0.05 MG/ML IJ SOLN
INTRAMUSCULAR | Status: AC
  Filled 2013-07-24: qty 4

## 2013-07-24 MED ORDER — HYDROMORPHONE HCL PF 1 MG/ML IJ SOLN
INTRAMUSCULAR | Status: AC
  Filled 2013-07-24: qty 1

## 2013-07-24 MED ORDER — METOCLOPRAMIDE HCL 5 MG PO TABS: 5.0000 mg | ORAL_TABLET | Freq: Three times a day (TID) | ORAL | Status: DC | PRN

## 2013-07-24 MED ORDER — FENTANYL CITRATE 0.05 MG/ML IJ SOLN
50.0000 ug | INTRAMUSCULAR | Status: DC | PRN
  Administered 2013-07-24: 100 ug via INTRAVENOUS

## 2013-07-24 MED ORDER — CEFAZOLIN SODIUM-DEXTROSE 2-3 GM-% IV SOLR
2.0000 g | INTRAVENOUS | Status: AC
  Administered 2013-07-24: 2 g via INTRAVENOUS

## 2013-07-24 MED ORDER — PROPOFOL 10 MG/ML IV BOLUS
INTRAVENOUS | Status: DC | PRN
  Administered 2013-07-24: 180 mg via INTRAVENOUS

## 2013-07-24 MED ORDER — ZOLPIDEM TARTRATE 5 MG PO TABS: 5.0000 mg | ORAL_TABLET | Freq: Every evening | ORAL | Status: DC | PRN

## 2013-07-24 MED ORDER — LIDOCAINE HCL (CARDIAC) 20 MG/ML IV SOLN
INTRAVENOUS | Status: DC | PRN
  Administered 2013-07-24: 40 mg via INTRAVENOUS

## 2013-07-24 MED ORDER — FENTANYL CITRATE 0.05 MG/ML IJ SOLN
INTRAMUSCULAR | Status: DC | PRN
  Administered 2013-07-24 (×4): 50 ug via INTRAVENOUS

## 2013-07-24 MED ORDER — MIDAZOLAM HCL 5 MG/5ML IJ SOLN
INTRAMUSCULAR | Status: DC | PRN
  Administered 2013-07-24: 2 mg via INTRAVENOUS

## 2013-07-24 MED ORDER — ZONISAMIDE 50 MG PO CAPS: 50.0000 mg | ORAL_CAPSULE | Freq: Two times a day (BID) | ORAL | Status: DC

## 2013-07-24 MED ORDER — HYDROMORPHONE HCL PF 1 MG/ML IJ SOLN
0.2500 mg | INTRAMUSCULAR | Status: DC | PRN
  Administered 2013-07-24 (×2): 0.5 mg via INTRAVENOUS

## 2013-07-24 MED ORDER — FENTANYL CITRATE 0.05 MG/ML IJ SOLN
INTRAMUSCULAR | Status: AC
  Filled 2013-07-24: qty 2

## 2013-07-24 MED ORDER — CEFAZOLIN SODIUM 1-5 GM-% IV SOLN: 1.0000 g | Freq: Four times a day (QID) | INTRAVENOUS | Status: DC

## 2013-07-24 MED ORDER — SODIUM CHLORIDE 0.9 % IV SOLN
INTRAVENOUS | Status: DC
  Administered 2013-07-24: 13:00:00 via INTRAVENOUS

## 2013-07-24 MED ORDER — OXYCODONE-ACETAMINOPHEN 5-325 MG PO TABS: 1.0000 | ORAL_TABLET | ORAL | Status: DC | PRN

## 2013-07-24 MED ORDER — MIDAZOLAM HCL 2 MG/2ML IJ SOLN
1.0000 mg | INTRAMUSCULAR | Status: DC | PRN
  Administered 2013-07-24: 2 mg via INTRAVENOUS

## 2013-07-24 MED ORDER — ONDANSETRON HCL 4 MG/2ML IJ SOLN
4.0000 mg | Freq: Four times a day (QID) | INTRAMUSCULAR | Status: DC | PRN
Start: 2013-07-24 — End: 2013-07-24

## 2013-07-24 MED ORDER — MIDAZOLAM HCL 2 MG/ML PO SYRP: 12.0000 mg | ORAL_SOLUTION | Freq: Once | ORAL | Status: DC | PRN

## 2013-07-24 MED ORDER — CEFAZOLIN SODIUM-DEXTROSE 2-3 GM-% IV SOLR
INTRAVENOUS | Status: AC
  Filled 2013-07-24: qty 50

## 2013-07-24 MED ORDER — LACTATED RINGERS IV SOLN
INTRAVENOUS | Status: DC
  Administered 2013-07-24: 10 mL/h via INTRAVENOUS

## 2013-07-24 MED ORDER — DEXAMETHASONE SODIUM PHOSPHATE 10 MG/ML IJ SOLN
INTRAMUSCULAR | Status: DC | PRN
  Administered 2013-07-24: 10 mg via INTRAVENOUS

## 2013-07-24 MED ORDER — LACTATED RINGERS IV SOLN
INTRAVENOUS | Status: DC
  Administered 2013-07-24 (×2): via INTRAVENOUS

## 2013-07-24 SURGICAL SUPPLY — 77 items
4.5x40 cannulated screw ×1 IMPLANT
BANDAGE ELASTIC 6 VELCRO ST LF (GAUZE/BANDAGES/DRESSINGS) ×2 IMPLANT
BANDAGE ESMARK 6X9 LF (GAUZE/BANDAGES/DRESSINGS) ×1 IMPLANT
BIT DRILL CANNULATED 3MM (DRILL) IMPLANT
BLADE CUDA 5.5 (BLADE) IMPLANT
BLADE CUDA GRT WHITE 3.5 (BLADE) IMPLANT
BLADE CUTTER GATOR 3.5 (BLADE) ×2 IMPLANT
BLADE CUTTER MENIS 5.5 (BLADE) IMPLANT
BLADE GREAT WHITE 4.2 (BLADE) ×2 IMPLANT
BLADE SAW SGTL 14.8X61X.97 HD (BLADE) IMPLANT
BLADE SURG 15 STRL LF DISP TIS (BLADE) ×1 IMPLANT
BLADE SURG 15 STRL SS (BLADE) ×2
BNDG CMPR 9X6 STRL LF SNTH (GAUZE/BANDAGES/DRESSINGS) ×1
BNDG ESMARK 6X9 LF (GAUZE/BANDAGES/DRESSINGS) ×2
BUR OVAL 4.0 (BURR) IMPLANT
CANISTER SUCT 3000ML (MISCELLANEOUS) IMPLANT
COVER TABLE BACK 60X90 (DRAPES) ×1 IMPLANT
CUTTER MENISCUS  4.2MM (BLADE)
CUTTER MENISCUS 4.2MM (BLADE) IMPLANT
DISPOSABLE KIT T3 AMZ ×1 IMPLANT
DRAPE ARTHROSCOPY W/POUCH 90 (DRAPES) ×2 IMPLANT
DRAPE OEC MINIVIEW 54X84 (DRAPES) ×2 IMPLANT
DRAPE U 20/CS (DRAPES) IMPLANT
DRAPE U-SHAPE 47X51 STRL (DRAPES) ×2 IMPLANT
DRILL CANNULATED 3MM (DRILL) ×4
DURAPREP 26ML APPLICATOR (WOUND CARE) ×2 IMPLANT
ELECT MENISCUS 165MM 90D (ELECTRODE) ×2 IMPLANT
ELECT REM PT RETURN 9FT ADLT (ELECTROSURGICAL) ×2
ELECTRODE REM PT RTRN 9FT ADLT (ELECTROSURGICAL) ×1 IMPLANT
GAUZE SPONGE 4X4 12PLY STRL (GAUZE/BANDAGES/DRESSINGS) ×4 IMPLANT
GAUZE XEROFORM 1X8 LF (GAUZE/BANDAGES/DRESSINGS) ×2 IMPLANT
GLOVE BIO SURGEON STRL SZ 6.5 (GLOVE) ×1 IMPLANT
GLOVE BIOGEL PI IND STRL 7.0 (GLOVE) ×1 IMPLANT
GLOVE BIOGEL PI INDICATOR 7.0 (GLOVE) ×2
GLOVE ECLIPSE 6.5 STRL STRAW (GLOVE) ×2 IMPLANT
GLOVE ORTHO TXT STRL SZ7.5 (GLOVE) ×4 IMPLANT
GOWN STRL REUS W/ TWL LRG LVL3 (GOWN DISPOSABLE) ×2 IMPLANT
GOWN STRL REUS W/ TWL XL LVL3 (GOWN DISPOSABLE) ×1 IMPLANT
GOWN STRL REUS W/TWL LRG LVL3 (GOWN DISPOSABLE) ×4
GOWN STRL REUS W/TWL XL LVL3 (GOWN DISPOSABLE) ×2
GUIDEWIRE 1.6 (WIRE) ×4
GUIDEWIRE ORTH 157X1.6XTROC (WIRE) ×2 IMPLANT
IMMOBILIZER KNEE 22 UNIV (SOFTGOODS) IMPLANT
IMMOBILIZER KNEE 24 THIGH 36 (MISCELLANEOUS) ×1 IMPLANT
IMMOBILIZER KNEE 24 UNIV (MISCELLANEOUS) ×2
IV NS IRRIG 3000ML ARTHROMATIC (IV SOLUTION) ×6 IMPLANT
KIT SUTURETAK 3 SPEAR TROCAR (KITS) IMPLANT
KNEE WRAP E Z 3 GEL PACK (MISCELLANEOUS) ×2 IMPLANT
MANIFOLD NEPTUNE II (INSTRUMENTS) ×2 IMPLANT
NDL SUT 6 .5 CRC .975X.05 MAYO (NEEDLE) IMPLANT
NEEDLE MAYO TAPER (NEEDLE)
PACK ARTHROSCOPY DSU (CUSTOM PROCEDURE TRAY) ×2 IMPLANT
PACK BASIN DAY SURGERY FS (CUSTOM PROCEDURE TRAY) ×2 IMPLANT
PENCIL BUTTON HOLSTER BLD 10FT (ELECTRODE) ×2 IMPLANT
SCREW LP CANN PT 4.5X50 (Screw) ×1 IMPLANT
SET ARTHROSCOPY TUBING (MISCELLANEOUS) ×2
SET ARTHROSCOPY TUBING LN (MISCELLANEOUS) ×1 IMPLANT
SPONGE LAP 18X18 X RAY DECT (DISPOSABLE) ×2 IMPLANT
SPONGE LAP 4X18 X RAY DECT (DISPOSABLE) ×1 IMPLANT
STRIP CLOSURE SKIN 1/2X4 (GAUZE/BANDAGES/DRESSINGS) ×2 IMPLANT
SUCTION FRAZIER TIP 10 FR DISP (SUCTIONS) ×2 IMPLANT
SUT 2 FIBERLOOP 20 STRT BLUE (SUTURE)
SUT ETHILON 3 0 PS 1 (SUTURE) ×2 IMPLANT
SUT FIBERWIRE #2 38 T-5 BLUE (SUTURE)
SUT VIC AB 0 CT1 27 (SUTURE) ×2
SUT VIC AB 0 CT1 27XBRD ANBCTR (SUTURE) ×1 IMPLANT
SUT VIC AB 1 CT1 27 (SUTURE)
SUT VIC AB 1 CT1 27XBRD ANBCTR (SUTURE) ×4 IMPLANT
SUT VIC AB 3-0 FS2 27 (SUTURE) IMPLANT
SUT VIC AB 3-0 SH 27 (SUTURE) ×2
SUT VIC AB 3-0 SH 27X BRD (SUTURE) ×1 IMPLANT
SUTURE 2 FIBERLOOP 20 STRT BLU (SUTURE) IMPLANT
SUTURE FIBERWR #2 38 T-5 BLUE (SUTURE) IMPLANT
TOWEL OR 17X24 6PK STRL BLUE (TOWEL DISPOSABLE) ×2 IMPLANT
TOWEL OR NON WOVEN STRL DISP B (DISPOSABLE) ×2 IMPLANT
WATER STERILE IRR 1000ML POUR (IV SOLUTION) ×2 IMPLANT
YANKAUER SUCT BULB TIP NO VENT (SUCTIONS) ×2 IMPLANT

## 2013-07-24 NOTE — Transfer of Care (Signed)
Immediate Anesthesia Transfer of Care Note  Patient: Jackie [name]  Procedure(s) Performed: Procedure(s): LEFT ARTHROSCOPY KNEE WITH LATERAL RELEASE,WITH DEBRIDEMENT/SHAVING (CHONDROPLASTY) ANTERIOR TIBIAL TUBERCLEPLASTY (Left)  Patient Location: PACU  Anesthesia Type:GA combined with regional for post-op pain  Level of Consciousness: awake, alert  and oriented  Airway & Oxygen Therapy: Patient Spontanous Breathing and Patient connected to face mask oxygen  Post-op Assessment: Report given to PACU RN, Post -op Vital signs reviewed and stable and Patient moving all extremities  Post vital signs: Reviewed and stable  Complications: No apparent anesthesia complications

## 2013-07-24 NOTE — Progress Notes (Signed)
Assisted Dr. Joslin with left, ultrasound guided, femoral block. Side rails up, monitors on throughout procedure. See vital signs in flow sheet. Tolerated Procedure well. 

## 2013-07-24 NOTE — Anesthesia Preprocedure Evaluation (Signed)
Anesthesia Evaluation  Patient identified by MRN, date of birth, ID band Patient awake    Reviewed: Allergy & Precautions, H&P , NPO status , Patient's Chart, lab work & pertinent test results  Airway Mallampati: I TM Distance: >3 FB Neck ROM: Full    Dental  (+) Teeth Intact, Dental Advisory Given   Pulmonary  breath sounds clear to auscultation        Cardiovascular Rhythm:Regular Rate:Normal     Neuro/Psych    GI/Hepatic   Endo/Other    Renal/GU      Musculoskeletal   Abdominal   Peds  Hematology   Anesthesia Other Findings   Reproductive/Obstetrics                           Anesthesia Physical Anesthesia Plan  ASA: I  Anesthesia Plan: General   Post-op Pain Management:    Induction: Intravenous  Airway Management Planned: LMA  Additional Equipment:   Intra-op Plan:   Post-operative Plan:   Informed Consent: I have reviewed the patients History and Physical, chart, labs and discussed the procedure including the risks, benefits and alternatives for the proposed anesthesia with the patient or authorized representative who has indicated his/her understanding and acceptance.   Dental advisory given  Plan Discussed with: CRNA and Anesthesiologist  Anesthesia Plan Comments: (Plan GA with LMA and femoral nerve block  Kipp Broodavid Brindle Leyba, MD)        Anesthesia Quick Evaluation

## 2013-07-24 NOTE — Interval H&P Note (Signed)
History and Physical Interval Note:  07/24/2013 7:37 AM  Jackie [name]  has presented today for surgery, with the diagnosis of Primary localized osteoarthrosis, lower leg, left  Chondromalacia of patella, left  Closed dislocation of patella, left, initial encounter   The various methods of treatment have been discussed with the patient and family. After consideration of risks, benefits and other options for treatment, the patient has consented to  Procedure(s): LEFT ARTHROSCOPY KNEE WITH LATERAL RELEASE,WITH DEBRIDEMENT/SHAVING (CHONDROPLASTY) ANTERIOR TIBIAL TUBERCLEPLASTY (Left) as a surgical intervention .  The patient's history has been reviewed, patient examined, no change in status, stable for surgery.  I have reviewed the patient's chart and labs.  Questions were answered to the patient's satisfaction.     Sandro Burgo F

## 2013-07-24 NOTE — Anesthesia Postprocedure Evaluation (Signed)
Anesthesia Post Note  Patient: Jackie [name]  Procedure(s) Performed: Procedure(s) (LRB): LEFT ARTHROSCOPY KNEE WITH LATERAL RELEASE,WITH DEBRIDEMENT/SHAVING (CHONDROPLASTY) ANTERIOR TIBIAL TUBERCLEPLASTY (Left)  Anesthesia type: General  Patient location: PACU  Post pain: Pain level controlled  Post assessment: Patient's Cardiovascular Status Stable  Last Vitals:  Filed Vitals:   07/24/13 1227  BP:   Pulse: 86  Temp:   Resp: 15    Post vital signs: Reviewed and stable  Level of consciousness: alert  Complications: No apparent anesthesia complications

## 2013-07-24 NOTE — Discharge Instructions (Signed)
Fulkerson's Procedure Care After Refer to this sheet in the next few weeks. These instructions provide you with information on caring for yourself after your procedure. Your caregiver may also give you specific instructions. Your treatment has been planned according to current medical practices, but problems sometimes occur. Call your caregiver if you have any problems or questions after your procedure. HOME CARE INSTRUCTIONS  Weight bearing as tolerated but must be in knee immobilizer at all times.  It is very important to elevate operative extremity as often as possible to decrease swelling.  Change dressing daily starting on Sunday.  May shower on Tuesday, but do not soak incisions.  May apply ice for up to 20 minutes at a time for pain and swelling.  Follow up appointment in one week.   To ease pain and swelling, apply ice to your knee, twice per day, for 2-3 days after your procedure:  Put ice in a plastic bag.  Place a towel between your skin and the bag.  Leave the ice on for 15 minutes.  Change dressings as directed.  Take over-the-counter or prescription medicines for pain, discomfort, or fever only as directed by your caregiver.  Use crutches or braces or splints as directed by your caregiver.  Do not exercise your leg unless instructed to do so by your caregiver. SEEK IMMEDIATE MEDICAL CARE IF:   You develop increased redness, swelling, or pain around your incision sites.  You have increased pain with movement of your knee.  You have a marked increase in swelling around your knee.  You have a lot of pain in your leg when you move your foot up and down at your ankle.  There is pus or any unusual drainage coming from your incision sites.  You develop a fever.  You notice a bad smell coming from your incision sites.  Any of your incisions break open (edges do not stay together) after sutures or staples have been removed. MAKE SURE YOU:  Understand these  instructions.  Will watch your condition.  Will get help right away if you are not doing well or get worse. Document Released: 07/15/2004 Document Revised: 09/20/2011 Document Reviewed: 05/21/2011 [employer] Patient Information 2015 [employer] [place]. This information is not intended to replace advice given to you by your health care provider. Make sure you discuss any questions you have with your health care provider.    Regional Anesthesia Blocks  1. Numbness or the inability to move the "blocked" extremity may last from 3-48 hours after placement. The length of time depends on the medication injected and your individual response to the medication. If the numbness is not going away after 48 hours, call your surgeon.  2. The extremity that is blocked will need to be protected until the numbness is gone and the  Strength has returned. Because you cannot feel it, you will need to take extra care to avoid injury. Because it may be weak, you may have difficulty moving it or using it. You may not know what position it is in without looking at it while the block is in effect.  3. For blocks in the legs and feet, returning to weight bearing and walking needs to be done carefully. You will need to wait until the numbness is entirely gone and the strength has returned. You should be able to move your leg and foot normally before you try and bear weight or walk. You will need someone to be with you when you first try to  ensure you do not fall and possibly risk injury.  4. Bruising and tenderness at the needle site are common side effects and will resolve in a few days.  5. Persistent numbness or new problems with movement should be communicated to the surgeon or the [facility] [phone] [facility] [phone]).   Post Anesthesia Home Care Instructions  Activity: Get plenty of rest for the remainder of the day. A responsible adult should stay with you for 24 hours  following the procedure.  For the next 24 hours, DO NOT: -Drive a car -Advertising copywriter -Drink alcoholic beverages -Take any medication unless instructed by your physician -Make any legal decisions or sign important papers.  Meals: Start with liquid foods such as gelatin or soup. Progress to regular foods as tolerated. Avoid greasy, spicy, heavy foods. If nausea and/or vomiting occur, drink only clear liquids until the nausea and/or vomiting subsides. Call your physician if vomiting continues.  Special Instructions/Symptoms: Your throat may feel dry or sore from the anesthesia or the breathing tube placed in your throat during surgery. If this causes discomfort, gargle with warm salt water. The discomfort should disappear within 24 hours.

## 2013-07-24 NOTE — Anesthesia Procedure Notes (Addendum)
Anesthesia Regional Block:  Femoral nerve block  Pre-Anesthetic Checklist: ,, timeout performed, Correct Patient, Correct Site, Correct Laterality, Correct Procedure, Correct Position, site marked, Risks and benefits discussed, Surgical consent,  Pre-op evaluation,  Post-op pain management  Laterality: Left  Prep: chloraprep       Needles:  Injection technique: Single-shot  Needle Type: Echogenic Stimulator Needle     Needle Length: 9cm 9 cm Needle Gauge: 22 and 22 G    Additional Needles:  Procedures: ultrasound guided (picture in chart) Femoral nerve block Narrative:  Start time: 07/24/2013 8:15 AM End time: 07/24/2013 8:20 AM Injection made incrementally with aspirations every 5 mL.  Additional Notes: 30 cc 0.5% Marcaine with 1:200 epi injected easily   Procedure Name: LMA Insertion Date/Time: 07/24/2013 9:41 AM Performed by: Burna CashONRAD, Kaisley Stiverson C Pre-anesthesia Checklist: Patient identified, Emergency Drugs available, Suction available and Patient being monitored Patient Re-evaluated:Patient Re-evaluated prior to inductionOxygen Delivery Method: Circle System Utilized Preoxygenation: Pre-oxygenation with 100% oxygen Intubation Type: IV induction Ventilation: Mask ventilation without difficulty LMA: LMA inserted LMA Size: 4.0 Number of attempts: 1 Airway Equipment and Method: bite block Placement Confirmation: positive ETCO2 Tube secured with: Tape Dental Injury: Teeth and Oropharynx as per pre-operative assessment

## 2013-07-28 NOTE — Op Note (Signed)
NAMDarden Amber:  HALL, Serene                ACCOUNT NO.:  [id number]  MEDICAL RECORD NO.:  [id number]  LOCATION:                                 FACILITY:  PHYSICIAN:  [name] Javonna Balli, M.D. DATE OF BIRTH:  1990-06-25  DATE OF PROCEDURE:  07/24/2013 DATE OF DISCHARGE:  07/24/2013                              OPERATIVE REPORT   PREOPERATIVE DIAGNOSES:  Left knee symptomatic chondromalacia patella. Markedly increased Q angle with lateral patellofemoral tracking and tethering medial plica.  POSTOPERATIVE DIAGNOSES:  Left knee symptomatic chondromalacia patella. Markedly increased Q angle with lateral patellofemoral tracking and tethering medial plica.  PROCEDURE:  Left knee exam under anesthesia, arthroscopy.  Assessment of tracking.  Excision of plica.  Arthroscopic lateral retinacular release. An open antero-medialization of the tibial tubercle screw fixation with lag screws x2.  Release of anterior compartment.  SURGEON:  [name] Britanie Harshman, M.D.  ASSISTANT:  Rayfield CitizenLindsay Anton PA, present throughout the entire case, necessary for timely completion of procedure.  ANESTHESIA:  General.  BLOOD LOSS:  Minimal.  SPECIMENS:  None.  CULTURES:  None.  COMPLICATIONS:  None.  DRESSINGS:  Soft compressive knee immobilizer.  TOURNIQUET TIME:  1 hour.  DESCRIPTION OF PROCEDURE:  The patient was brought to the operating room, placed on the operating table in supine position.  After adequate anesthesia had been obtained, tourniquet applied, prepped and draped in usual sterile fashion.  Both legs had been examined.  Markedly increased Q angle on both sides, but more lateral tracking, tethering on the left compared to right.  Stable ligaments.  Left leg was exsanguinated with elevation of Esmarch.  Tourniquet inflated to 300 mmHg.  Two portals, one each medial and lateral parapatellar.  Arthroscope introduced, knee distended and inspected.  Obvious picture of lateral tracking, lateral overload,  lateral tethering.  A recurrent fibrotic medial plica excised in its entirety.  Articular cartilage otherwise did not look bad.  You could not correct her lateral tethering.  Medial meniscus, medial compartment, lateral meniscus, lateral compartment, cruciate ligaments normal.  After looking at tracking from all aspects, I then did a lateral release from the vastus lateralis superiorly down the lateral joint line inferiorly.  Marked improvement of tethering, obviously still a tendency to lateral tracking.  Instruments and fluid removed. Anterior incision over patellar tendon, straight down from the tibia. Skin and subcutaneous tissue divided.  I then exposed the proximal tibia for osteotomy.  Anterior compartment was elevated off and then released all the way distal to prevent postop compartment syndrome.  Utilizing the Arthrex instrumentation, 3 K-wires were passed from medial to lateral, angling these more into a flat pattern distally.  Once I was comfortable with the position, I used a saw to create the oblique osteotomy.  Osteotome was used at the top.  The distal end was left intact.  Once this was mobilized, I could bring it over about 11 mm medially going about the same anteriorly.  Marked nice correction of her Q angle.  With fluoroscopic guidance, this was then fixed with cannulated screws with guidewires, drills, and then lag screws placed. These were countersunk as much as possible anteriorly, firmly brought down in place.  The very distal part of the lateral release, I could not access from the joint, was then completed under the skin.  At completion, I had full motion, marked improvement of tracking and tethering.  Arthroscope reintroduced just to confirm good positioning. Once this was confirmed, instruments and fluid removed.  Wounds irrigated.  Portals were closed with nylon.  Incision closed with subcutaneous, subcuticular Vicryl.  Sterile compressive dressing applied.   Tourniquet deflated and removed.  Knee immobilizer applied. Anesthesia reversed.  Brought to the recovery room.  Tolerated the surgery well, no complications.     Loreta Ave, M.D.     DFM/MEDQ  D:  07/24/2013  T:  07/25/2013  Job:  [id number]

## 2013-07-30 ENCOUNTER — Encounter (HOSPITAL_BASED_OUTPATIENT_CLINIC_OR_DEPARTMENT_OTHER): Payer: Self-pay | Admitting: Orthopedic Surgery

## 2013-08-15 ENCOUNTER — Ambulatory Visit: Payer: 59 | Admitting: Nurse Practitioner

## 2013-08-23 ENCOUNTER — Other Ambulatory Visit: Payer: Self-pay | Admitting: Neurology

## 2013-10-09 DIAGNOSIS — M2241 Chondromalacia patellae, right knee: Secondary | ICD-10-CM

## 2013-10-09 DIAGNOSIS — M79A29 Nontraumatic compartment syndrome of unspecified lower extremity: Secondary | ICD-10-CM

## 2013-10-09 HISTORY — DX: Nontraumatic compartment syndrome of unspecified lower extremity: M79.A29

## 2013-10-09 HISTORY — DX: Chondromalacia patellae, right knee: M22.41

## 2013-10-13 ENCOUNTER — Telehealth: Payer: Self-pay | Admitting: *Deleted

## 2013-10-13 NOTE — Telephone Encounter (Signed)
Called patient to r/s appointment on 10/24/13 at 11:30 am, patient cancelled and stated that she will call back later to r/s appointment.

## 2013-10-20 ENCOUNTER — Other Ambulatory Visit: Payer: Self-pay | Admitting: Physician Assistant

## 2013-10-23 ENCOUNTER — Encounter (HOSPITAL_BASED_OUTPATIENT_CLINIC_OR_DEPARTMENT_OTHER): Payer: Self-pay | Admitting: *Deleted

## 2013-10-24 ENCOUNTER — Ambulatory Visit: Payer: 59 | Admitting: Nurse Practitioner

## 2013-10-29 NOTE — H&P (Signed)
Jackie [name]/[facility] [address]   [address] [place], [place] [phone] A Division of [facility]  [name] Dantae Meunier, M.D.   Robert A. Thurston HoleWainer, M.D.   Burnell BlanksW. Dan Caffrey, M.D.   Eulas PostJoshua P. Landau, M.D.   Lunette StandsAnna Voytek, M.D Jewel Baizeimothy D. Eulah PontMurphy, M.D.  Buford DresserWesley R. Ibazebo, M.D.  Estell HarpinJames S. Kramer, M.D.    Melina Fiddlerebecca S. Bassett, M.D. Mary L. Isidoro DonningAnton, PA-C  Kirstin A. Shepperson, PA-C  Josh Hardinghadwell, PA-C [place], [place]   RE: Henri MedalHall, [place]kyah   [phone]      DOB: [birth year] PROGRESS NOTE: 10-14-13 Jackie [name] comes in for follow-up after operative intervention left knee but more importantly to discuss what we're going to do on her right knee. Left knee is doing well. She has completed physical therapy has good motion good strength and significant resolution of symptoms. She is not up to full strength and has not completed rehab but overall is doing well. She is still using a PSO brace with activity and is continuing her home exercise program. That is the side I did arthroscopy lateral release and Fulkerson procedure on 07/24/13 The more pressing issue and most of today's visit centered around her right knee. She has had retropatellar symptoms there just as long as the left. Although that has been the case it has not been the thrust of any of our documentation because we've concentrated so much on the left. Right knee has retropatellar symptoms. With the left knee getting better these are now getting worse and worse. All of this is retropatellar. Worse with stairs squatting and all activity that stress the patellofemoral joint. No instability. Occasional feeling of catching and locking with some swelling.  She has had right knee symptoms going back at least 4 years. She would like to discuss doing something definite there. We've taken into account what has been done on the left in discussion in regards to the right knee. Remaining history previous operative  treatment reviewed.   General exam is outlined included in the chart.  EXAMINATION: She has normal femoral anteversion negative log roll both hips. On the left her Q-angle has been nicely corrected with her Fulkerson. She has a good lateral release. No grating no crepitus minimal swelling. There is still a little quad weakness but overall she is doing well with rehab. She is neurovascularly intact distally. On the right she has lateral patellofemoral tracking and tethering. A little bit of patellofemoral crepitus although not extreme. A little sore over her medial plica. No real meniscal signs. Ligaments are stable. She is neurovascularly intact distally. Q-angle is exaggerated as the left one was. Reasonable strength. A little VMO atrophy although not marked. Really no instability but she has pain with patella compression.  X-RAYS: I obtained a lateral and sunrise view of the right, I already had a good AP view. This shows lateral patellofemoral positioning as expected. No significant degenerative changes.  Continued   Jackie [name]/[facility] [address]   [address] [place], [place] [phone] A Division of [facility]  [name] Reika Callanan, M.D.   Robert A. Thurston HoleWainer, M.D.   Burnell BlanksW. Dan Caffrey, M.D.   Eulas PostJoshua P. Landau, M.D.   Lunette StandsAnna Voytek, M.D Jewel Baizeimothy D. Eulah PontMurphy, M.D.  Buford DresserWesley R. Ibazebo, M.D.  Estell HarpinJames S. Kramer, M.D.    Melina Fiddlerebecca S. Bassett, M.D. Mary L. Isidoro DonningAnton, PA-C  Kirstin A. Shepperson, PA-C  Josh Lake Wildwoodhadwell, PA-C [place], [place]   RE: Henri MedalHall, Jackie [name]  [phone]      DOB: Apr 19, 1990 PROGRESS NOTE: 10-14-13 DISPOSITION: 1. In regards to the left knee she will continue her strengthening program. I told her she can wean out of the PSO brace when she's ready. Routine follow-up in a month when she is 3 months out.  2. In regards to the right knee we had a long face-to-face discussion more than 25 minutes. I went over all treatment options. I  have again emphasized that we can try arthroscopy lateral release and rehab. At the end of the day the final correction is a Fulkerson procedure. She is leaning in that direction and I think that might be the best way to go for the long-run. This is exactly the same discussion we had on the left. For the sake of completeness I want to get an MRI to look at structures. She will call me when that's complete. I did finalize the paperwork for exam under anesthesia arthroscopy chondroplasty lateral release and Fulkerson procedure on the right.  She does know she has the option of trying scope and lateral release but is leaning towards a Fulkerson and I understand her reasoning. She has done a lot of research on her own.  [name] [name], M.D.  Electronically verified by [name] Derrich Gaby, M.D. DFM:kah D 10-15-13 T 10-16-13

## 2013-10-30 ENCOUNTER — Encounter (HOSPITAL_BASED_OUTPATIENT_CLINIC_OR_DEPARTMENT_OTHER): Payer: Self-pay

## 2013-10-30 ENCOUNTER — Encounter (HOSPITAL_BASED_OUTPATIENT_CLINIC_OR_DEPARTMENT_OTHER)
Admission: RE | Disposition: A | Discharge status: Home / Self Care | Payer: Self-pay | Source: Ambulatory Visit | Attending: Orthopedic Surgery

## 2013-10-30 ENCOUNTER — Ambulatory Visit (HOSPITAL_BASED_OUTPATIENT_CLINIC_OR_DEPARTMENT_OTHER): Payer: BC Managed Care – PPO | Admitting: Anesthesiology

## 2013-10-30 ENCOUNTER — Encounter (HOSPITAL_BASED_OUTPATIENT_CLINIC_OR_DEPARTMENT_OTHER): Payer: BC Managed Care – PPO | Admitting: Anesthesiology

## 2013-10-30 ENCOUNTER — Ambulatory Visit (HOSPITAL_BASED_OUTPATIENT_CLINIC_OR_DEPARTMENT_OTHER)
Admission: RE | Admit: 2013-10-30 | Discharge: 2013-10-31 | Disposition: A | Discharge status: Home / Self Care | Payer: BC Managed Care – PPO | Source: Ambulatory Visit | Attending: Orthopedic Surgery | Admitting: Orthopedic Surgery

## 2013-10-30 DIAGNOSIS — M1711 Unilateral primary osteoarthritis, right knee: Secondary | ICD-10-CM | POA: Insufficient documentation

## 2013-10-30 DIAGNOSIS — M25861 Other specified joint disorders, right knee: Secondary | ICD-10-CM | POA: Diagnosis not present

## 2013-10-30 DIAGNOSIS — M2241 Chondromalacia patellae, right knee: Secondary | ICD-10-CM | POA: Insufficient documentation

## 2013-10-30 DIAGNOSIS — M6751 Plica syndrome, right knee: Secondary | ICD-10-CM | POA: Diagnosis not present

## 2013-10-30 DIAGNOSIS — R51 Headache: Secondary | ICD-10-CM | POA: Diagnosis not present

## 2013-10-30 DIAGNOSIS — M222X9 Patellofemoral disorders, unspecified knee: Secondary | ICD-10-CM | POA: Diagnosis present

## 2013-10-30 HISTORY — PX: FASCIOTOMY: SHX132

## 2013-10-30 HISTORY — DX: Nontraumatic compartment syndrome of unspecified lower extremity: M79.A29

## 2013-10-30 HISTORY — DX: Chondromalacia patellae, right knee: M22.41

## 2013-10-30 HISTORY — PX: KNEE ARTHROSCOPY WITH FULKERSON SLIDE: SHX5648

## 2013-10-30 LAB — POCT HEMOGLOBIN-HEMACUE: Hemoglobin: 14.3 g/dL (ref 12.0–15.0)

## 2013-10-30 SURGERY — ARTHROSCOPY, KNEE, WITH FULKERSON OSTEOTOMY
Anesthesia: General | Site: Knee | Laterality: Right

## 2013-10-30 MED ORDER — PROMETHAZINE HCL 25 MG/ML IJ SOLN: 6.2500 mg | INTRAMUSCULAR | Status: DC | PRN

## 2013-10-30 MED ORDER — MIDAZOLAM HCL 2 MG/ML PO SYRP: 12.0000 mg | ORAL_SOLUTION | Freq: Once | ORAL | Status: AC | PRN

## 2013-10-30 MED ORDER — METOCLOPRAMIDE HCL 5 MG PO TABS: 5.0000 mg | ORAL_TABLET | Freq: Three times a day (TID) | ORAL | Status: DC | PRN

## 2013-10-30 MED ORDER — ONDANSETRON HCL 4 MG/2ML IJ SOLN
INTRAMUSCULAR | Status: DC | PRN
  Administered 2013-10-30: 4 mg via INTRAVENOUS

## 2013-10-30 MED ORDER — BUPIVACAINE-EPINEPHRINE (PF) 0.5% -1:200000 IJ SOLN
INTRAMUSCULAR | Status: DC | PRN
  Administered 2013-10-30: 30 mL via PERINEURAL

## 2013-10-30 MED ORDER — LIDOCAINE HCL (CARDIAC) 20 MG/ML IV SOLN
INTRAVENOUS | Status: DC | PRN
  Administered 2013-10-30: 50 mg via INTRAVENOUS

## 2013-10-30 MED ORDER — HYDROMORPHONE HCL 1 MG/ML IJ SOLN
0.2500 mg | INTRAMUSCULAR | Status: DC | PRN
Start: 2013-10-30 — End: 2013-10-31
  Administered 2013-10-30 (×4): 0.5 mg via INTRAVENOUS

## 2013-10-30 MED ORDER — MIDAZOLAM HCL 2 MG/2ML IJ SOLN
INTRAMUSCULAR | Status: AC
  Filled 2013-10-30: qty 2

## 2013-10-30 MED ORDER — OXYCODONE HCL 5 MG/5ML PO SOLN: 5.0000 mg | Freq: Once | ORAL | Status: AC | PRN

## 2013-10-30 MED ORDER — OXYCODONE-ACETAMINOPHEN 5-325 MG PO TABS: 1.0000 | ORAL_TABLET | ORAL | Status: DC | PRN

## 2013-10-30 MED ORDER — DEXTROSE 5 % IV SOLN: 500.0000 mg | Freq: Four times a day (QID) | INTRAVENOUS | Status: DC | PRN

## 2013-10-30 MED ORDER — ONDANSETRON HCL 4 MG PO TABS: 4.0000 mg | ORAL_TABLET | Freq: Four times a day (QID) | ORAL | Status: DC | PRN

## 2013-10-30 MED ORDER — HYDROMORPHONE HCL 1 MG/ML IJ SOLN
INTRAMUSCULAR | Status: AC
  Filled 2013-10-30: qty 1

## 2013-10-30 MED ORDER — OXYCODONE HCL 5 MG PO TABS: 5.0000 mg | ORAL_TABLET | Freq: Once | ORAL | Status: AC | PRN

## 2013-10-30 MED ORDER — PROPOFOL 10 MG/ML IV BOLUS
INTRAVENOUS | Status: DC | PRN
  Administered 2013-10-30: 200 mg via INTRAVENOUS

## 2013-10-30 MED ORDER — CEFAZOLIN SODIUM-DEXTROSE 2-3 GM-% IV SOLR
INTRAVENOUS | Status: AC
  Filled 2013-10-30: qty 50

## 2013-10-30 MED ORDER — SODIUM CHLORIDE 0.9 % IV SOLN: INTRAVENOUS | Status: DC

## 2013-10-30 MED ORDER — MIDAZOLAM HCL 2 MG/2ML IJ SOLN
1.0000 mg | INTRAMUSCULAR | Status: DC | PRN
  Administered 2013-10-30: 2 mg via INTRAVENOUS

## 2013-10-30 MED ORDER — ONDANSETRON HCL 4 MG/2ML IJ SOLN: 4.0000 mg | Freq: Four times a day (QID) | INTRAMUSCULAR | Status: DC | PRN

## 2013-10-30 MED ORDER — METHOCARBAMOL 500 MG PO TABS
500.0000 mg | ORAL_TABLET | Freq: Four times a day (QID) | ORAL | Status: DC | PRN
Start: 2013-10-30 — End: 2013-10-31
  Administered 2013-10-30 – 2013-10-31 (×3): 500 mg via ORAL
  Filled 2013-10-30 (×3): qty 1

## 2013-10-30 MED ORDER — DEXAMETHASONE SODIUM PHOSPHATE 4 MG/ML IJ SOLN
INTRAMUSCULAR | Status: DC | PRN
  Administered 2013-10-30: 10 mg via INTRAVENOUS

## 2013-10-30 MED ORDER — SODIUM CHLORIDE 0.9 % IR SOLN
Status: DC | PRN
  Administered 2013-10-30: 2500 mL

## 2013-10-30 MED ORDER — FENTANYL CITRATE 0.05 MG/ML IJ SOLN
INTRAMUSCULAR | Status: AC
  Filled 2013-10-30: qty 2

## 2013-10-30 MED ORDER — MORPHINE SULFATE 10 MG/ML IJ SOLN
INTRAMUSCULAR | Status: DC | PRN
  Administered 2013-10-30: 2 mg via INTRAVENOUS
  Administered 2013-10-30: 3 mg via INTRAVENOUS
  Administered 2013-10-30: 2 mg via INTRAVENOUS

## 2013-10-30 MED ORDER — METOCLOPRAMIDE HCL 5 MG/ML IJ SOLN: 5.0000 mg | Freq: Three times a day (TID) | INTRAMUSCULAR | Status: DC | PRN

## 2013-10-30 MED ORDER — LACTATED RINGERS IV SOLN
INTRAVENOUS | Status: DC
  Administered 2013-10-30 (×2): via INTRAVENOUS

## 2013-10-30 MED ORDER — LACTATED RINGERS IV SOLN
INTRAVENOUS | Status: DC
  Administered 2013-10-30: 10:00:00 via INTRAVENOUS

## 2013-10-30 MED ORDER — CHLORHEXIDINE GLUCONATE 4 % EX LIQD: 60.0000 mL | Freq: Once | CUTANEOUS | Status: DC

## 2013-10-30 MED ORDER — HYDROMORPHONE HCL 1 MG/ML IJ SOLN: 0.5000 mg | INTRAMUSCULAR | Status: DC | PRN

## 2013-10-30 MED ORDER — FENTANYL CITRATE 0.05 MG/ML IJ SOLN
50.0000 ug | INTRAMUSCULAR | Status: DC | PRN
  Administered 2013-10-30: 100 ug via INTRAVENOUS

## 2013-10-30 MED ORDER — OXYCODONE-ACETAMINOPHEN 5-325 MG PO TABS
1.0000 | ORAL_TABLET | ORAL | Status: DC | PRN
  Administered 2013-10-30 – 2013-10-31 (×5): 2 via ORAL
  Filled 2013-10-30 (×5): qty 2

## 2013-10-30 MED ORDER — ONDANSETRON HCL 4 MG PO TABS: 4.0000 mg | ORAL_TABLET | Freq: Three times a day (TID) | ORAL | Status: DC | PRN

## 2013-10-30 MED ORDER — CEFAZOLIN SODIUM-DEXTROSE 2-3 GM-% IV SOLR
2.0000 g | INTRAVENOUS | Status: AC
  Administered 2013-10-30: 2 g via INTRAVENOUS

## 2013-10-30 MED ORDER — MORPHINE SULFATE 10 MG/ML IJ SOLN
INTRAMUSCULAR | Status: AC
  Filled 2013-10-30: qty 1

## 2013-10-30 SURGICAL SUPPLY — 79 items
APL SKNCLS STERI-STRIP NONHPOA (GAUZE/BANDAGES/DRESSINGS) ×2
BANDAGE ELASTIC 6 VELCRO ST LF (GAUZE/BANDAGES/DRESSINGS) ×3 IMPLANT
BANDAGE ESMARK 6X9 LF (GAUZE/BANDAGES/DRESSINGS) ×2 IMPLANT
BENZOIN TINCTURE PRP APPL 2/3 (GAUZE/BANDAGES/DRESSINGS) ×3 IMPLANT
BIT DRILL CANNULATED 3MM (DRILL) ×2 IMPLANT
BLADE CUDA 5.5 (BLADE) IMPLANT
BLADE CUDA GRT WHITE 3.5 (BLADE) IMPLANT
BLADE CUTTER GATOR 3.5 (BLADE) ×3 IMPLANT
BLADE CUTTER MENIS 5.5 (BLADE) IMPLANT
BLADE GREAT WHITE 4.2 (BLADE) ×3 IMPLANT
BLADE SAW SGTL 14.8X61X.97 HD (BLADE) ×3 IMPLANT
BLADE SURG 15 STRL LF DISP TIS (BLADE) ×2 IMPLANT
BLADE SURG 15 STRL SS (BLADE) ×3
BNDG CMPR 9X6 STRL LF SNTH (GAUZE/BANDAGES/DRESSINGS) ×2
BNDG COHESIVE 4X5 TAN STRL (GAUZE/BANDAGES/DRESSINGS) ×2 IMPLANT
BNDG ESMARK 6X9 LF (GAUZE/BANDAGES/DRESSINGS) ×3
BUR OVAL 4.0 (BURR) IMPLANT
CANISTER SUCT 3000ML (MISCELLANEOUS) IMPLANT
COVER BACK TABLE 60X90IN (DRAPES) ×2 IMPLANT
CUTTER MENISCUS  4.2MM (BLADE)
CUTTER MENISCUS 4.2MM (BLADE) IMPLANT
DRAPE ARTHROSCOPY W/POUCH 90 (DRAPES) ×3 IMPLANT
DRAPE OEC MINIVIEW 54X84 (DRAPES) ×3 IMPLANT
DRAPE U-SHAPE 47X51 STRL (DRAPES) ×3 IMPLANT
DRILL CANNULATED 3MM (DRILL) ×6
DURAPREP 26ML APPLICATOR (WOUND CARE) ×3 IMPLANT
ELECT MENISCUS 165MM 90D (ELECTRODE) ×2 IMPLANT
ELECT REM PT RETURN 9FT ADLT (ELECTROSURGICAL) ×3
ELECTRODE REM PT RTRN 9FT ADLT (ELECTROSURGICAL) ×2 IMPLANT
GAUZE SPONGE 4X4 12PLY STRL (GAUZE/BANDAGES/DRESSINGS) ×6 IMPLANT
GAUZE XEROFORM 1X8 LF (GAUZE/BANDAGES/DRESSINGS) ×3 IMPLANT
GLOVE BIO SURGEON STRL SZ 6.5 (GLOVE) ×2 IMPLANT
GLOVE BIOGEL PI IND STRL 7.0 (GLOVE) ×3 IMPLANT
GLOVE BIOGEL PI INDICATOR 7.0 (GLOVE) ×2
GLOVE ECLIPSE 6.5 STRL STRAW (GLOVE) ×3 IMPLANT
GLOVE EXAM NITRILE LRG STRL (GLOVE) ×2 IMPLANT
GLOVE ORTHO TXT STRL SZ7.5 (GLOVE) ×6 IMPLANT
GOWN STRL REUS W/ TWL LRG LVL3 (GOWN DISPOSABLE) ×4 IMPLANT
GOWN STRL REUS W/ TWL XL LVL3 (GOWN DISPOSABLE) ×2 IMPLANT
GOWN STRL REUS W/TWL LRG LVL3 (GOWN DISPOSABLE) ×6
GOWN STRL REUS W/TWL XL LVL3 (GOWN DISPOSABLE) ×3
GUIDEWIRE 1.6 (WIRE) ×6
GUIDEWIRE ORTH 157X1.6XTROC (WIRE) ×2 IMPLANT
IMMOBILIZER KNEE 22 UNIV (SOFTGOODS) ×2 IMPLANT
IMMOBILIZER KNEE 24 THIGH 36 (MISCELLANEOUS) IMPLANT
IMMOBILIZER KNEE 24 UNIV (MISCELLANEOUS)
KIT DISPOSABLE T3AM2 (KITS) ×2 IMPLANT
KIT SUTURETAK 3 SPEAR TROCAR (KITS) IMPLANT
KNEE WRAP E Z 3 GEL PACK (MISCELLANEOUS) ×3 IMPLANT
MANIFOLD NEPTUNE II (INSTRUMENTS) ×3 IMPLANT
NDL SUT 6 .5 CRC .975X.05 MAYO (NEEDLE) IMPLANT
NEEDLE MAYO TAPER (NEEDLE)
PACK ARTHROSCOPY DSU (CUSTOM PROCEDURE TRAY) ×3 IMPLANT
PACK BASIN DAY SURGERY FS (CUSTOM PROCEDURE TRAY) ×3 IMPLANT
PENCIL BUTTON HOLSTER BLD 10FT (ELECTRODE) ×3 IMPLANT
SCREW LP 4.5X40 (Screw) ×1 IMPLANT
SCREW LP 4.5X40MM (Screw) ×3 IMPLANT
SCREW LP CANN PT 4.5X50 (Screw) ×2 IMPLANT
SET ARTHROSCOPY TUBING (MISCELLANEOUS) ×3
SET ARTHROSCOPY TUBING LN (MISCELLANEOUS) ×2 IMPLANT
SPONGE LAP 4X18 X RAY DECT (DISPOSABLE) ×3 IMPLANT
STRIP CLOSURE SKIN 1/2X4 (GAUZE/BANDAGES/DRESSINGS) ×3 IMPLANT
SUCTION FRAZIER TIP 10 FR DISP (SUCTIONS) ×3 IMPLANT
SUT ETHILON 3 0 PS 1 (SUTURE) ×3 IMPLANT
SUT FIBERWIRE #2 38 T-5 BLUE (SUTURE)
SUT MNCRL AB 4-0 PS2 18 (SUTURE) ×2 IMPLANT
SUT VIC AB 0 CT1 27 (SUTURE) ×3
SUT VIC AB 0 CT1 27XBRD ANBCTR (SUTURE) ×2 IMPLANT
SUT VIC AB 1 CT1 27 (SUTURE)
SUT VIC AB 1 CT1 27XBRD ANBCTR (SUTURE) ×4 IMPLANT
SUT VIC AB 2-0 SH 27 (SUTURE) ×3
SUT VIC AB 2-0 SH 27XBRD (SUTURE) ×1 IMPLANT
SUT VIC AB 3-0 FS2 27 (SUTURE) IMPLANT
SUT VIC AB 3-0 SH 27 (SUTURE) ×3
SUT VIC AB 3-0 SH 27X BRD (SUTURE) ×2 IMPLANT
SUTURE FIBERWR #2 38 T-5 BLUE (SUTURE) IMPLANT
TOWEL OR 17X24 6PK STRL BLUE (TOWEL DISPOSABLE) ×3 IMPLANT
TOWEL OR NON WOVEN STRL DISP B (DISPOSABLE) ×2 IMPLANT
YANKAUER SUCT BULB TIP NO VENT (SUCTIONS) ×3 IMPLANT

## 2013-10-30 NOTE — Anesthesia Postprocedure Evaluation (Signed)
Anesthesia Post Note  Patient: Jackie Denman GeorgeL Jackie [name]  Procedure(s) Performed: Procedure(s) (LRB): RIGHT KNEE SCOPE LATERAL RELEASE/CHONDROPLASTY ANTERIOR TIBIAL TUBERCLEPLASTY (Right) RELEASE RIGHT ANTERIOR COMPARTMENT FASCIOTOMY  (Right)  Anesthesia type: general  Patient location: PACU  Post pain: Pain level controlled  Post assessment: Patient's Cardiovascular Status Stable  Last Vitals:  Filed Vitals:   10/30/13 1342  BP: 121/77  Pulse: 93  Temp:   Resp: 18    Post vital signs: Reviewed and stable  Level of consciousness: sedated  Complications: No apparent anesthesia complications

## 2013-10-30 NOTE — Anesthesia Preprocedure Evaluation (Addendum)
Anesthesia Evaluation  Patient identified by MRN, date of birth, ID band Patient awake    Reviewed: Allergy & Precautions, H&P , NPO status , Patient's Chart, lab work & pertinent test results  History of Anesthesia Complications Negative for: history of anesthetic complications  Airway Mallampati: II TM Distance: >3 FB Neck ROM: Full    Dental  (+) Teeth Intact, Dental Advisory Given   Pulmonary neg pulmonary ROS,    Pulmonary exam normal       Cardiovascular negative cardio ROS      Neuro/Psych  Headaches, negative psych ROS   GI/Hepatic negative GI ROS, Neg liver ROS,   Endo/Other  negative endocrine ROS  Renal/GU negative Renal ROS     Musculoskeletal   Abdominal   Peds  Hematology   Anesthesia Other Findings   Reproductive/Obstetrics negative OB ROS                          Anesthesia Physical Anesthesia Plan  ASA: I  Anesthesia Plan: General LMA   Post-op Pain Management: MAC Combined w/ Regional for Post-op pain   Induction: Intravenous  Airway Management Planned: LMA  Additional Equipment:   Intra-op Plan:   Post-operative Plan: Extubation in OR  Informed Consent: I have reviewed the patients History and Physical, chart, labs and discussed the procedure including the risks, benefits and alternatives for the proposed anesthesia with the patient or authorized representative who has indicated his/her understanding and acceptance.   Dental advisory given  Plan Discussed with: CRNA, Anesthesiologist and Surgeon  Anesthesia Plan Comments:        Anesthesia Quick Evaluation                                   Anesthesia Evaluation  Patient identified by MRN, date of birth, ID band Patient awake    Reviewed: Allergy & Precautions, H&P , NPO status , Patient's Chart, lab work & pertinent test results  Airway Mallampati: I TM Distance: >3 FB Neck ROM: Full    Dental  (+) Teeth Intact, Dental Advisory Given   Pulmonary  breath sounds clear to auscultation        Cardiovascular Rhythm:Regular Rate:Normal     Neuro/Psych    GI/Hepatic   Endo/Other    Renal/GU      Musculoskeletal   Abdominal   Peds  Hematology   Anesthesia Other Findings   Reproductive/Obstetrics                           Anesthesia Physical Anesthesia Plan  ASA: I  Anesthesia Plan: General   Post-op Pain Management:    Induction: Intravenous  Airway Management Planned: LMA  Additional Equipment:   Intra-op Plan:   Post-operative Plan:   Informed Consent: I have reviewed the patients History and Physical, chart, labs and discussed the procedure including the risks, benefits and alternatives for the proposed anesthesia with the patient or authorized representative who has indicated his/her understanding and acceptance.   Dental advisory given  Plan Discussed with: CRNA and Anesthesiologist  Anesthesia Plan Comments: (Plan GA with LMA and femoral nerve block  Kipp Broodavid Joslin, MD)        Anesthesia Quick Evaluation

## 2013-10-30 NOTE — Anesthesia Procedure Notes (Addendum)
Anesthesia Regional Block:  Femoral nerve block  Pre-Anesthetic Checklist: ,, timeout performed, Correct Patient, Correct Site, Correct Laterality, Correct Procedure, Correct Position, site marked, Risks and benefits discussed,  Surgical consent,  Pre-op evaluation,  At surgeon's request and post-op pain management  Laterality: Right  Prep: chloraprep       Needles:  Injection technique: Single-shot  Needle Type: Echogenic Stimulator Needle     Needle Length: 5cm 5 cm Needle Gauge: 22 and 22 G    Additional Needles:  Procedures: ultrasound guided (picture in chart) and nerve stimulator Femoral nerve block  Nerve Stimulator or Paresthesia:  Response: quadraceps contraction, 0.45 mA,   Additional Responses:   Narrative:  Start time: 10/30/2013 9:52 AM End time: 10/30/2013 10:02 AM Injection made incrementally with aspirations every 5 mL.  Performed by: Personally  Anesthesiologist: Halford DecampJ. Daniel Singer, MD  Additional Notes: Functioning IV was confirmed and monitors were applied.  A 50mm 22ga Arrow echogenic stimulator needle was used. Sterile prep and drape,hand hygiene and sterile gloves were used. Ultrasound guidance: relevant anatomy identified, needle position confirmed, local anesthetic spread visualized around nerve(s)., vascular puncture avoided.  Image printed for medical record. Negative aspiration and negative test dose prior to incremental administration of local anesthetic. The patient tolerated the procedure well.     Procedure Name: LMA Insertion Performed by: York GricePEARSON, Ansel Ferrall W Pre-anesthesia Checklist: Patient identified, Timeout performed, Emergency Drugs available, Suction available and Patient being monitored Patient Re-evaluated:Patient Re-evaluated prior to inductionOxygen Delivery Method: Circle system utilized Preoxygenation: Pre-oxygenation with 100% oxygen Intubation Type: IV induction Ventilation: Mask ventilation without difficulty LMA: LMA  inserted LMA Size: 4.0 Number of attempts: 1 Placement Confirmation: positive ETCO2 and breath sounds checked- equal and bilateral Tube secured with: Tape Dental Injury: Teeth and Oropharynx as per pre-operative assessment

## 2013-10-30 NOTE — Transfer of Care (Signed)
Immediate Anesthesia Transfer of Care Note  Patient: Jackie [name] GeorgeL Hall  Procedure(s) Performed: Procedure(s) with comments: RIGHT KNEE SCOPE LATERAL RELEASE/CHONDROPLASTY ANTERIOR TIBIAL TUBERCLEPLASTY (Right) - ANESTHESIA:  GENERAL, PRE/POST OP FEMORAL NERVE RELEASE RIGHT ANTERIOR COMPARTMENT FASCIOTOMY  (Right)  Patient Location: PACU  Anesthesia Type:General and GA combined with regional for post-op pain  Level of Consciousness: awake and alert   Airway & Oxygen Therapy: Patient Spontanous Breathing and Patient connected to face mask oxygen  Post-op Assessment: Report given to PACU RN and Post -op Vital signs reviewed and stable  Post vital signs: Reviewed and stable  Complications: No apparent anesthesia complications

## 2013-10-30 NOTE — Discharge Instructions (Signed)
Weight bearing as tolerated but must be in knee immobilizer at all times.  Change bandages daily starting in 3 days.  May shower in 3 days, but don't soak incisions.  May apply ice for up to 20 minutes at a time for pain and swelling.  Follow up appointment in one week.   SEEK IMMEDIATE MEDICAL CARE IF:   You develop increased redness, swelling, or pain around your incision sites.  You have a marked increase in swelling around your knee.       There is pus or any unusual drainage coming from your incision sites.  You develop a fever.  You notice a bad smell coming from your incision sites.  Any of your incisions break open (edges do not stay together) after sutures or staples have been removed. Document Released: 07/15/2004 Document Revised: 05/12/2013 Document Reviewed: 05/21/2011 [facility] Patient Information 2015 [employer], [place]. This information is not intended to replace advice given to you by your health care provider. Make sure you discuss any questions you have with your health care provider.

## 2013-10-30 NOTE — Interval H&P Note (Signed)
History and Physical Interval Note:  10/30/2013 7:36 AM  Gearline L Margo AyeHall  has presented today for surgery, with the diagnosis of UNILATERAL PRIMARY OSTEOARTHRITIS RIGHT KNEE, CHONDROMALACIA PATELLAE, UNSPECIFIED DISLOCATION OF RIGHT PATELLA, SEQUELA, NONTRAUMATIC COMPARTMENT SYNDROME OF RIGHT LOWER EXTREMITY  The various methods of treatment have been discussed with the patient and family. After consideration of risks, benefits and other options for treatment, the patient has consented to  Procedure(s) with comments: RIGHT KNEE SCOPE LATERAL RELEASE/CHONDROPLASTY ANTERIOR TIBIAL TUBERCLEPLASTY (Right) - ANESTHESIA:  GENERAL, PRE/POST OP FEMORAL NERVE RELEASE RIGHT ANTERIOR COMPARTMENT FASCIOTOMY  (Right) as a surgical intervention .  The patient's history has been reviewed, patient examined, no change in status, stable for surgery.  I have reviewed the patient's chart and labs.  Questions were answered to the patient's satisfaction.     Jenesis Suchy F

## 2013-10-30 NOTE — Progress Notes (Signed)
Assisted Dr. Singer with right, ultrasound guided, femoral block. Side rails up, monitors on throughout procedure. See vital signs in flow sheet. Tolerated Procedure well.  

## 2013-10-31 ENCOUNTER — Encounter (HOSPITAL_BASED_OUTPATIENT_CLINIC_OR_DEPARTMENT_OTHER): Payer: Self-pay | Admitting: Orthopedic Surgery

## 2013-10-31 DIAGNOSIS — M1711 Unilateral primary osteoarthritis, right knee: Secondary | ICD-10-CM | POA: Diagnosis not present

## 2013-11-03 NOTE — Op Note (Signed)
NAMDarden Amber:  [name], Jackie                ACCOUNT NO.:  [id number]  MEDICAL RECORD NO.:  [id number]  LOCATION:                                 FACILITY:  PHYSICIAN:  [name] Natasia Sanko, M.D. DATE OF BIRTH:  03/13/1990  DATE OF PROCEDURE:  10/30/2013 DATE OF DISCHARGE:  10/31/2013                              OPERATIVE REPORT   PREOPERATIVE DIAGNOSES:  Right knee increased Q-angle with lateral patellofemoral tracking and tethering, chondromalacia patella, and medial plica.  POSTOPERATIVE DIAGNOSES:  Right knee increased Q-angle with lateral patellofemoral tracking and tethering, chondromalacia patella, and medial plica.  PROCEDURE:  Right knee exam under anesthesia, arthroscopy.  Superficial chondroplasty patella.  Excision, medial plica.  Arthroscopic lateral retinacular release.  Open anteromedialization tibial tubercle (full crescent procedure).  Fixation with 2 lag screws.  Prophylactic anterior compartment fasciotomy.  SURGEON:  [name] Jonisha Kindig, M.D.  ASSISTANT:  Rayfield CitizenLindsay Anton PA, present throughout the entire case, necessary for timely completion of procedure.  ANESTHESIA:  General.  BLOOD LOSS:  Minimal.  SPECIMENS:  None.  CULTURES:  None.  COMPLICATIONS:  None.  DRESSINGS:  Soft compressive knee immobilizer.  TOURNIQUET TIME:  1 hour and 10 minutes.  PROCEDURE:  The patient was brought to operating room, placed on the operating table in supine position.  After adequate anesthesia had been obtained, tourniquet applied, prepped and draped in usual sterile fashion.  Exsanguinated with elevation of Esmarch.  Tourniquet inflated to 350 mmHg.  Two portals, one each medial and lateral parapatellar. Arthroscope was introduced.  Knee distended and inspected.  Confirmed lateral tracking tethering.  Reasonable trochlear groove.  Some grade 2 changes lateral patella were debrided.  Stout medial plica excised. Medial meniscus, medial compartment, lateral meniscus,  lateral compartment, and cruciate ligaments intact.  With the arthroscope in place, I did a lateral release from the vastus lateralis superiorly down to the lateral joint line inferiorly.  Definitely improved tethering, but she still has maltracking firmer increased Q-angle.  Instruments and fluid removed.  Anterior incision midline.  Skin and subcutaneous tissue were divided.  Anterior compartment radius off the tibia laterally.  I then did a complete subcutaneous release of the anterior compartment to prevent postop compartment syndrome.  This was done under direct visualization for long minutes.  Both sides of the tibia exposed.  Three guidewires were then placed to help with the osteotomy.  Oblique osteotomy was then performed from medial to lateral tapering this smoothly distally.  I could then mobilize the 10 cm portion of the tubercle that had been osteotomized and bring it over medially.  The osteotomy was created to bring it forward a cm and medially a cm.  Once that was confirmed, it was fixed with 2 cannulated lag screws, predrilled, tapped, and countersunk.  Once that correction was complete, arthroscope was reintroduced.  Confirmed excellent correction of the Q angle.  Excellent tracking throughout and good lateral release.  Wound was irrigated.  Subcutaneous and subcuticular closure.  Portals were closed with nylon.  Sterile compressive dressing was applied. Tourniquet was deflated and removed.  Knee immobilizer was applied. Anesthesia reversed.  Brought to the recovery room.  Tolerated the surgery well.  No  complications.     [name] Kelcey Korus, M.D.     DFM/MEDQ  D:  10/30/2013  T:  10/30/2013  Job:  [id number]

## 2013-11-07 ENCOUNTER — Encounter (HOSPITAL_BASED_OUTPATIENT_CLINIC_OR_DEPARTMENT_OTHER): Payer: Self-pay | Admitting: Orthopedic Surgery

## 2014-05-08 ENCOUNTER — Encounter: Payer: Self-pay | Admitting: Nurse Practitioner

## 2014-05-08 ENCOUNTER — Ambulatory Visit (INDEPENDENT_AMBULATORY_CARE_PROVIDER_SITE_OTHER): Payer: BLUE CROSS/BLUE SHIELD | Admitting: Nurse Practitioner

## 2014-05-08 VITALS — BP 104/73 | HR 76 | Ht 68.0 in | Wt 170.5 lb

## 2014-05-08 DIAGNOSIS — G43909 Migraine, unspecified, not intractable, without status migrainosus: Secondary | ICD-10-CM

## 2014-05-08 DIAGNOSIS — Z5181 Encounter for therapeutic drug level monitoring: Secondary | ICD-10-CM

## 2014-05-08 MED ORDER — RIZATRIPTAN BENZOATE 10 MG PO TBDP: 10.0000 mg | ORAL_TABLET | ORAL | Status: DC | PRN

## 2014-05-08 MED ORDER — ZONISAMIDE 50 MG PO CAPS: 50.0000 mg | ORAL_CAPSULE | Freq: Two times a day (BID) | ORAL | Status: DC

## 2014-05-08 NOTE — Progress Notes (Signed)
[facility]  PATIENT: Jackie [name] DOB: 04-06-1990   REASON FOR VISIT: Follow-up for history of migraine  HISTORY FROM: Patient    HISTORY OF PRESENT ILLNESS:[name], 24 year old female returns for followup. She was last seen in the office 02/14/2013.  She has a history of migraine and was placed on Zonegran 50 mg twice a day along with Maxalt when necessary.  Her headaches have been worse recently as she ran out of her Maxalt prescription about 2 months ago. She is taking a lot of Excedrin migraine which she says really doesn't work. She denies any side effects to the  Zonegran or Maxalt. She denies any motor or sensory deficits with her headaches, no visual loss no speech problems. She returns for reevaluation. She is not aware of any migraine triggers    HISTORY:She had long-standing history of migraines, since age 35, has seen different treatment in the past, including [facility], Dr. Neale Burly, she was taking imipramine for 3 years, initially it was helping her, but later it become ineffective,  About 3 months ago, July 2014, she was put on Topamax, she is currently taking 100 mg every day, she did not see significant improvement of her headaches, complains the side effect of numbness tingling of her face, and in her extremities.  July 2014, she has typical migraines about couple times in a month, now she has almost daily headaches, she complains of pressure, dull, achy pain at her left frontal, occipital, parietal region, daily basis, she has been taking Flexeril, Phenergan as needed for headaches.  She denies visual loss, she denies dysarthria, she denies lateralized motor or sensory deficit    REVIEW OF SYSTEMS: Full 14 system review of systems performed and notable only for those listed, all others are neg:  Constitutional: neg  Cardiovascular: neg Ear/Nose/Throat: neg  Skin: neg Eyes: neg Respiratory: neg Gastroitestinal: neg    Hematology/Lymphatic: neg  Endocrine: neg Musculoskeletal:neg Allergy/Immunology: neg Neurological:  Headache Psychiatric: neg Sleep : neg   ALLERGIES: Allergies  Allergen Reactions  . Topamax [Topiramate] Other (See Comments)    NUMBNESS OF HANDS AND FACE    HOME MEDICATIONS: Outpatient Prescriptions Prior to Visit  Medication Sig Dispense Refill  . ondansetron (ZOFRAN) 4 MG tablet Take 1 tablet (4 mg total) by mouth every 8 (eight) hours as needed for nausea or vomiting. 40 tablet 0  . rizatriptan (MAXALT-MLT) 5 MG disintegrating tablet Take 1 tablet (5 mg total) by mouth as needed for migraine. May repeat in 2 hours if needed 15 tablet 6  . zonisamide (ZONEGRAN) 50 MG capsule TAKE ONE CAPSULE TWICE A DAY 60 capsule 3  . oxyCODONE-acetaminophen (ROXICET) 5-325 MG per tablet Take 1-2 tablets by mouth every 4 (four) hours as needed. 60 tablet 0   No facility-administered medications prior to visit.    PAST MEDICAL HISTORY: Past Medical History  Diagnosis Date  . Migraines   . Chondromalacia of right patella 10/2013  . Osteoarthritis of right knee 10/2013  . Compartment syndrome, nontraumatic, lower extremity 10/2013    right    PAST SURGICAL HISTORY: Past Surgical History  Procedure Laterality Date  . Knee arthroscopy w/ plica excision Left 07/12/2010  . Knee arthroscopy with lateral release Left 07/24/2013    Procedure: LEFT ARTHROSCOPY KNEE WITH LATERAL RELEASE,WITH DEBRIDEMENT/SHAVING (CHONDROPLASTY) ANTERIOR TIBIAL TUBERCLEPLASTY;  Surgeon: Loreta Ave, MD;  Location: [facility];  Service: Orthopedics;  Laterality: Left;  . Knee arthroscopy with fulkerson slide Right 10/30/2013  Procedure: RIGHT KNEE SCOPE LATERAL RELEASE/CHONDROPLASTY ANTERIOR TIBIAL TUBERCLEPLASTY;  Surgeon: Loreta Aveaniel F Murphy, MD;  Location: [facility];  Service: Orthopedics;  Laterality: Right;  ANESTHESIA:  GENERAL, PRE/POST OP FEMORAL NERVE  . Fasciotomy Right  10/30/2013    Procedure: RELEASE RIGHT ANTERIOR COMPARTMENT FASCIOTOMY ;  Surgeon: Loreta Aveaniel F Murphy, MD;  Location: [facility];  Service: Orthopedics;  Laterality: Right;    FAMILY HISTORY: Family History  Problem Relation Age of Onset  . Thyroid disease Mother   . Diabetes Paternal Grandmother     SOCIAL HISTORY: History   Social History  . Marital Status: Single    Spouse Name: N/A  . Number of Children: 0  . Years of Education: 14   Occupational History  . MINOR    Social History Main Topics  . Smoking status: Never Smoker   . Smokeless tobacco: Never Used  . Alcohol Use: Yes     Comment: occasionally  . Drug Use: No  . Sexual Activity: Not on file   Other Topics Concern  . Not on file   Social History Narrative   Patient is single and lives with a roommate.   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   Patient has a college education.   Patient is right-handed.   Patient drinks very little caffeine.     PHYSICAL EXAM  Filed Vitals:   05/08/14 0814  BP: 104/73  Pulse: 76  Height: 5\' 8"  (1.727 m)  Weight: 170 lb 8 oz (77.338 kg)   Body mass index is 25.93 kg/(m^2).  Generalized: Well developed, in no acute distress  Head: normocephalic and atraumatic,. Oropharynx benign  Neck: Supple, no carotid bruits  Cardiac: Regular rate rhythm, no murmur  Musculoskeletal: No deformity   Neurological examination   Mentation: Alert oriented to time, place, history taking. Attention span and concentration appropriate. Recent and remote memory intact.  Follows all commands speech and language fluent.   Cranial nerve II-XII: Pupils were equal round reactive to light extraocular movements were full, visual field were full on confrontational test. Facial sensation and strength were normal. hearing was intact to finger rubbing bilaterally. Uvula tongue midline. head turning and shoulder shrug were normal and symmetric.Tongue protrusion into cheek strength was  normal. Motor: normal bulk and tone, full strength in the BUE, BLE, fine finger movements normal, no pronator drift. No focal weakness Coordination: finger-nose-finger, heel-to-shin bilaterally, no dysmetria Reflexes: Brachioradialis 2/2, biceps 2/2, triceps 2/2, patellar 2/2, Achilles 2/2, plantar responses were flexor bilaterally. Gait and Station: Rising up from seated position without assistance, normal stance,  moderate stride, good arm swing, smooth turning, able to perform tiptoe, and heel walking without difficulty. Tandem gait is steady  DIAGNOSTIC DATA (LABS, IMAGING, TESTING) -  ASSESSMENT AND PLAN  24 y.o. year old female  has a past medical history of Migraines; here to follow-up. She ran out of her Maxalt approximately 2 months ago and has had several headaches since that. She has been using Excedrin Migraine with little benefit. She remains on Zonegran. She has not had any recent lab work  Will check labs today to monitor adverse effects of Zonegran, CBC, CMP Will increase Maxalt 10 mg melt when necessary acute migraine will refill Continue Zonegran as preventive will refill I think that the patient has a component of rebound/medication overuse headache.  We discussed the rebound phenomenon.  I asked the patient to decrease the abortive medications currently being used to no more than 2 days per  week. Do not  use over-the-counter medications for headache I spent 10 additional minutes in total face to face time with the patient more than 50% of which was spent counseling and coordination of care,  reviewing medications and discussing and reviewing the diagnosis of migraine and  further treatment options.  Follow-up yearly and when necessary. Vst time 20 min Nilda Riggs, [facility], [facility], APRN  [facility] [address] [place], [place] [address] [phone]

## 2014-05-08 NOTE — Patient Instructions (Signed)
Will check labs today to monitor adverse effects of Zonegran Will increase Maxalt 10 mg melt when necessary acute migraine will refill Continue Zonegran as preventive will refill Limit the use of over-the-counter medications for headache Follow-up yearly and when necessary

## 2014-05-08 NOTE — Progress Notes (Signed)
I have reviewed and agreed above plan. 

## 2014-05-09 LAB — CBC WITH DIFFERENTIAL/PLATELET
Basophils Absolute: 0 10*3/uL (ref 0.0–0.2)
Basos: 0 %
EOS (ABSOLUTE): 0 10*3/uL (ref 0.0–0.4)
EOS: 0 %
HEMATOCRIT: 44.4 % (ref 34.0–46.6)
HEMOGLOBIN: 15 g/dL (ref 11.1–15.9)
IMMATURE GRANS (ABS): 0 10*3/uL (ref 0.0–0.1)
Immature Granulocytes: 0 %
Lymphocytes Absolute: 1.5 10*3/uL (ref 0.7–3.1)
Lymphs: 23 %
MCH: 30.4 pg (ref 26.6–33.0)
MCHC: 33.8 g/dL (ref 31.5–35.7)
MCV: 90 fL (ref 79–97)
MONOS ABS: 0.4 10*3/uL (ref 0.1–0.9)
Monocytes: 7 %
NEUTROS ABS: 4.4 10*3/uL (ref 1.4–7.0)
NEUTROS PCT: 70 %
Platelets: 246 10*3/uL (ref 150–379)
RBC: 4.94 x10E6/uL (ref 3.77–5.28)
RDW: 12.9 % (ref 12.3–15.4)
WBC: 6.4 10*3/uL (ref 3.4–10.8)

## 2014-05-09 LAB — COMPREHENSIVE METABOLIC PANEL
A/G RATIO: 2 (ref 1.1–2.5)
ALT: 12 IU/L (ref 0–32)
AST: 11 IU/L (ref 0–40)
Albumin: 4.5 g/dL (ref 3.5–5.5)
Alkaline Phosphatase: 96 IU/L (ref 39–117)
BUN/Creatinine Ratio: 18 (ref 8–20)
BUN: 12 mg/dL (ref 6–20)
Bilirubin Total: 0.4 mg/dL (ref 0.0–1.2)
CALCIUM: 9.7 mg/dL (ref 8.7–10.2)
CO2: 20 mmol/L (ref 18–29)
CREATININE: 0.68 mg/dL (ref 0.57–1.00)
Chloride: 103 mmol/L (ref 97–108)
GFR calc non Af Amer: 125 mL/min/{1.73_m2} (ref >59)
GFR, EST AFRICAN AMERICAN: 144 mL/min/{1.73_m2} (ref >59)
Globulin, Total: 2.2 g/dL (ref 1.5–4.5)
Glucose: 82 mg/dL (ref 65–99)
Potassium: 4.3 mmol/L (ref 3.5–5.2)
Sodium: 142 mmol/L (ref 134–144)
TOTAL PROTEIN: 6.7 g/dL (ref 6.0–8.5)

## 2014-05-11 ENCOUNTER — Telehealth: Payer: Self-pay | Admitting: Nurse Practitioner

## 2014-05-11 NOTE — Telephone Encounter (Signed)
Please let patient know CBC and CMP were normal

## 2014-05-11 NOTE — Telephone Encounter (Signed)
I called pt and relayed the results of lab tests, normal study.  She verbalized understanding.

## 2014-12-29 ENCOUNTER — Ambulatory Visit (INDEPENDENT_AMBULATORY_CARE_PROVIDER_SITE_OTHER): Payer: BLUE CROSS/BLUE SHIELD | Admitting: Family Medicine

## 2014-12-29 VITALS — BP 118/80 | HR 80 | Temp 98.0°F | Resp 16 | Ht 66.0 in | Wt 178.0 lb

## 2014-12-29 DIAGNOSIS — J028 Acute pharyngitis due to other specified organisms: Secondary | ICD-10-CM

## 2014-12-29 LAB — POCT RAPID STREP A (OFFICE): Rapid Strep A Screen: NEGATIVE

## 2014-12-29 MED ORDER — PENICILLIN V POTASSIUM 500 MG PO TABS: 500.0000 mg | ORAL_TABLET | Freq: Two times a day (BID) | ORAL | Status: DC

## 2014-12-29 NOTE — Progress Notes (Signed)
[facility] [address], [place] [address] [phone]  Date:  12/29/2014   Name:  Jackie [name]   DOB:  07/11/1990   MRN:  [id number]  PCP:  Allean Found, MD    Chief Complaint: tonsils swollen; Shortness of Breath; Nasal Congestion; and Ear Pain   History of Present Illness:  Jackie [name] is a 24 y.o. very pleasant female patient who presents with the following:  Here today with illness- last week she noted onset of tonsillar swelling.   She does have some cough this week only.   She notes pain in both ears.  At night her tonsils swell and it can make it hard to breathe.  However she is not having any other SOB  She does not snore.  She tends to have a lot of tonsil infections- she states that she gets "tonsilits" several times a year but it is generally not due to strep, generally resolves if she gargles with salt water at home for a few days.  Has never seen ENT but would like to- she plans to call herself  OW she is generally healthy except for history of migraine. No history of seizures.    Patient Active Problem List   Diagnosis Date Noted  . Patellofemoral syndrome 10/30/2013  . Patella-femoral syndrome 07/24/2013  . Migraine 11/14/2012  . ABDOMINAL PAIN-RUQ 12/23/2009    Past Medical History  Diagnosis Date  . Migraines   . Chondromalacia of right patella 10/2013  . Osteoarthritis of right knee 10/2013  . Compartment syndrome, nontraumatic, lower extremity 10/2013    right    Past Surgical History  Procedure Laterality Date  . Knee arthroscopy w/ plica excision Left 07/12/2010  . Knee arthroscopy with lateral release Left 07/24/2013    Procedure: LEFT ARTHROSCOPY KNEE WITH LATERAL RELEASE,WITH DEBRIDEMENT/SHAVING (CHONDROPLASTY) ANTERIOR TIBIAL TUBERCLEPLASTY;  Surgeon: Loreta Ave, MD;  Location: [facility];  Service: Orthopedics;  Laterality: Left;  . Knee arthroscopy with fulkerson slide Right 10/30/2013     Procedure: RIGHT KNEE SCOPE LATERAL RELEASE/CHONDROPLASTY ANTERIOR TIBIAL TUBERCLEPLASTY;  Surgeon: Loreta Ave, MD;  Location: [facility];  Service: Orthopedics;  Laterality: Right;  ANESTHESIA:  GENERAL, PRE/POST OP FEMORAL NERVE  . Fasciotomy Right 10/30/2013    Procedure: RELEASE RIGHT ANTERIOR COMPARTMENT FASCIOTOMY ;  Surgeon: Loreta Ave, MD;  Location: [facility];  Service: Orthopedics;  Laterality: Right;    Social History  Substance Use Topics  . Smoking status: Never Smoker   . Smokeless tobacco: Never Used  . Alcohol Use: Yes     Comment: occasionally    Family History  Problem Relation Age of Onset  . Thyroid disease Mother   . Diabetes Paternal Grandmother     Allergies  Allergen Reactions  . Topamax [Topiramate] Other (See Comments)    NUMBNESS OF HANDS AND FACE    Medication list has been reviewed and updated.  Current Outpatient Prescriptions on File Prior to Visit  Medication Sig Dispense Refill  . ALPRAZolam (XANAX) 0.5 MG tablet Take 0.5 mg by mouth 2 (two) times daily as needed.   0  . ondansetron (ZOFRAN) 4 MG tablet Take 1 tablet (4 mg total) by mouth every 8 (eight) hours as needed for nausea or vomiting. 40 tablet 0  . rizatriptan (MAXALT-MLT) 10 MG disintegrating tablet Take 1 tablet (10 mg total) by mouth as needed for migraine. May repeat in 2 hours if needed 10  tablet 6  . UNKNOWN TO PATIENT Other medication for anxiety.  Takes TID.    Marland Kitchen. zonisamide (ZONEGRAN) 50 MG capsule Take 1 capsule (50 mg total) by mouth 2 (two) times daily. 60 capsule 11   No current facility-administered medications on file prior to visit.    Review of Systems:  As per HPI- otherwise negative.   Physical Examination: Filed Vitals:   12/29/14 0959  BP: 118/80  Pulse: 80  Temp: 98 F (36.7 C)  Resp: 16   Filed Vitals:   12/29/14 0959  Height: 5\' 6"  (1.676 m)  Weight: 178 lb (80.74 kg)   Body mass index is 28.74  kg/(m^2). Ideal Body Weight: Weight in (lb) to have BMI = 25: 154.6  GEN: WDWN, NAD, Non-toxic, A & O x 3, mild overweight, looks well HEENT: Atraumatic, Normocephalic. Neck supple. No masses, No LAD.  Bilateral TM wnl, oropharynx normal.  PEERL,EOMI.   Her tonsils are actually well within normal range- not enlarged.  They are slightly erythematous, no exudate Ears and Nose: No external deformity. CV: RRR, No M/G/R. No JVD. No thrill. No extra heart sounds. PULM: CTA B, no wheezes, crackles, rhonchi. No retractions. No resp. distress. No accessory muscle use. EXTR: No c/c/e NEURO Normal gait.  PSYCH: Normally interactive. Conversant. Not depressed or anxious appearing.  Calm demeanor.     Assessment and Plan: Acute pharyngitis due to other specified organisms - Plan: POCT rapid strep A, penicillin v potassium (VEETID) 500 MG tablet, Culture, Group A Strep  Here today with concern about ST for several days.  Will start on pcn while we await her throat culture.  Plan follow-up with culture result and she will consult ENT for their opinion   Signed Abbe AmsterdamJessica Copland, MD

## 2014-12-29 NOTE — Patient Instructions (Signed)
We are going to treat your sore throat with penicillin- let me know if you are not getting better and I will be in touch with your throat culture asap  Let me know if you need a referral to the ENT doctor who you would like to see

## 2014-12-30 LAB — CULTURE, GROUP A STREP: Organism ID, Bacteria: NORMAL

## 2015-05-14 ENCOUNTER — Ambulatory Visit: Payer: BLUE CROSS/BLUE SHIELD | Admitting: Nurse Practitioner

## 2015-05-21 ENCOUNTER — Ambulatory Visit (INDEPENDENT_AMBULATORY_CARE_PROVIDER_SITE_OTHER): Payer: BLUE CROSS/BLUE SHIELD | Admitting: Nurse Practitioner

## 2015-05-21 ENCOUNTER — Encounter: Payer: Self-pay | Admitting: Nurse Practitioner

## 2015-05-21 VITALS — BP 108/75 | HR 60 | Ht 66.0 in | Wt 176.8 lb

## 2015-05-21 DIAGNOSIS — G43909 Migraine, unspecified, not intractable, without status migrainosus: Secondary | ICD-10-CM

## 2015-05-21 MED ORDER — ZONISAMIDE 50 MG PO CAPS: 50.0000 mg | ORAL_CAPSULE | Freq: Two times a day (BID) | ORAL | Status: DC

## 2015-05-21 MED ORDER — ONDANSETRON HCL 4 MG PO TABS: 4.0000 mg | ORAL_TABLET | Freq: Three times a day (TID) | ORAL | Status: DC | PRN

## 2015-05-21 MED ORDER — RIZATRIPTAN BENZOATE 10 MG PO TBDP: 10.0000 mg | ORAL_TABLET | ORAL | Status: DC | PRN
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
## 2015-05-21 NOTE — Patient Instructions (Signed)
Continue Zonegran as your headache preventative Continue Maxalt renewed Continue Zofran renewed Call for increase in headaches Follow-up in 6 months

## 2015-06-30 DIAGNOSIS — F411 Generalized anxiety disorder: Secondary | ICD-10-CM | POA: Diagnosis not present

## 2015-07-17 DIAGNOSIS — J069 Acute upper respiratory infection, unspecified: Secondary | ICD-10-CM | POA: Diagnosis not present

## 2015-09-28 ENCOUNTER — Other Ambulatory Visit (HOSPITAL_COMMUNITY)
Admission: RE | Admit: 2015-09-28 | Discharge: 2015-09-28 | Disposition: A | Discharge status: Home / Self Care | Payer: BLUE CROSS/BLUE SHIELD | Source: Ambulatory Visit | Attending: Nurse Practitioner | Admitting: Nurse Practitioner

## 2015-09-28 ENCOUNTER — Other Ambulatory Visit: Payer: Self-pay | Admitting: Nurse Practitioner

## 2015-09-28 DIAGNOSIS — Z01419 Encounter for gynecological examination (general) (routine) without abnormal findings: Secondary | ICD-10-CM | POA: Insufficient documentation

## 2015-09-28 DIAGNOSIS — N898 Other specified noninflammatory disorders of vagina: Secondary | ICD-10-CM | POA: Diagnosis not present

## 2015-09-28 DIAGNOSIS — Z113 Encounter for screening for infections with a predominantly sexual mode of transmission: Secondary | ICD-10-CM | POA: Diagnosis not present

## 2015-09-28 DIAGNOSIS — Z309 Encounter for contraceptive management, unspecified: Secondary | ICD-10-CM | POA: Diagnosis not present

## 2015-09-28 DIAGNOSIS — Z3202 Encounter for pregnancy test, result negative: Secondary | ICD-10-CM | POA: Diagnosis not present

## 2015-09-28 DIAGNOSIS — R350 Frequency of micturition: Secondary | ICD-10-CM | POA: Diagnosis not present

## 2015-09-30 DIAGNOSIS — R350 Frequency of micturition: Secondary | ICD-10-CM | POA: Diagnosis not present

## 2015-09-30 LAB — CYTOLOGY - PAP

## 2015-10-04 DIAGNOSIS — R110 Nausea: Secondary | ICD-10-CM | POA: Diagnosis not present

## 2015-10-04 DIAGNOSIS — N910 Primary amenorrhea: Secondary | ICD-10-CM | POA: Diagnosis not present

## 2015-10-19 ENCOUNTER — Emergency Department (HOSPITAL_BASED_OUTPATIENT_CLINIC_OR_DEPARTMENT_OTHER)
Admission: EM | Admit: 2015-10-19 | Discharge: 2015-10-19 | Disposition: A | Discharge status: Home / Self Care | Payer: BLUE CROSS/BLUE SHIELD | Attending: Emergency Medicine | Admitting: Emergency Medicine

## 2015-10-19 ENCOUNTER — Encounter (HOSPITAL_BASED_OUTPATIENT_CLINIC_OR_DEPARTMENT_OTHER): Payer: Self-pay | Admitting: *Deleted

## 2015-10-19 DIAGNOSIS — M549 Dorsalgia, unspecified: Secondary | ICD-10-CM | POA: Diagnosis not present

## 2015-10-19 DIAGNOSIS — G43909 Migraine, unspecified, not intractable, without status migrainosus: Secondary | ICD-10-CM | POA: Insufficient documentation

## 2015-10-19 DIAGNOSIS — Z79899 Other long term (current) drug therapy: Secondary | ICD-10-CM | POA: Insufficient documentation

## 2015-10-19 DIAGNOSIS — G43009 Migraine without aura, not intractable, without status migrainosus: Secondary | ICD-10-CM | POA: Diagnosis not present

## 2015-10-19 DIAGNOSIS — R51 Headache: Secondary | ICD-10-CM | POA: Diagnosis not present

## 2015-10-19 MED ORDER — DEXAMETHASONE SODIUM PHOSPHATE 10 MG/ML IJ SOLN
10.0000 mg | Freq: Once | INTRAMUSCULAR | Status: AC
  Administered 2015-10-19: 10 mg via INTRAVENOUS
  Filled 2015-10-19: qty 1

## 2015-10-19 MED ORDER — KETOROLAC TROMETHAMINE 30 MG/ML IJ SOLN
30.0000 mg | Freq: Once | INTRAMUSCULAR | Status: AC
  Administered 2015-10-19: 30 mg via INTRAVENOUS
  Filled 2015-10-19: qty 1

## 2015-10-19 MED ORDER — DIPHENHYDRAMINE HCL 50 MG/ML IJ SOLN
25.0000 mg | Freq: Once | INTRAMUSCULAR | Status: AC
  Administered 2015-10-19: 25 mg via INTRAVENOUS
  Filled 2015-10-19: qty 1

## 2015-10-19 MED ORDER — PROCHLORPERAZINE EDISYLATE 5 MG/ML IJ SOLN
10.0000 mg | Freq: Once | INTRAMUSCULAR | Status: AC
  Administered 2015-10-19: 10 mg via INTRAVENOUS
  Filled 2015-10-19: qty 2

## 2015-10-19 MED ORDER — DIAZEPAM 5 MG/ML IJ SOLN
2.0000 mg | Freq: Once | INTRAMUSCULAR | Status: AC
  Administered 2015-10-19: 2 mg via INTRAVENOUS
  Filled 2015-10-19: qty 2

## 2015-10-19 MED ORDER — SODIUM CHLORIDE 0.9 % IV BOLUS (SEPSIS)
1000.0000 mL | Freq: Once | INTRAVENOUS | Status: AC
  Administered 2015-10-19: 1000 mL via INTRAVENOUS

## 2015-10-19 NOTE — ED Provider Notes (Signed)
[facility] Provider Note   CSN: [id number] Arrival date & time: 10/19/15  1355     History   Chief Complaint Chief Complaint  Patient presents with  . Headache    HPI Jackie [name] is a 25 y.o. female.  The history is provided by the patient and medical records. No language interpreter was used.    Jackie [name] is a 25 y.o. female  with a PMH of migraines who presents to the Emergency Department complaining of gradually worsening, constant headache that began three weeks ago. On Saturday, 4 days ago, she felt as if her headache developed into a typical migraine. Sunday, she started developing numbness/tingling to bilateral aspects of her face L>R. Yesterday she notes that pain was worsened by moving her neck, stating that "volts" shoot from bilateral temporal area all the way down the back to her knees when she moves neck up or down. She tried her home migraine medication, rizatriptan, with no relief. She also takes daily preventative migraine medication and has been taking this as directed.   + nausea No fever, vomiting.   Past Medical History:  Diagnosis Date  . Chondromalacia of right patella 10/2013  . Compartment syndrome, nontraumatic, lower extremity 10/2013   right  . Migraines     Patient Active Problem List   Diagnosis Date Noted  . Patellofemoral syndrome 10/30/2013  . Patella-femoral syndrome 07/24/2013  . Migraine 11/14/2012  . ABDOMINAL PAIN-RUQ 12/23/2009    Past Surgical History:  Procedure Laterality Date  . FASCIOTOMY Right 10/30/2013   Procedure: RELEASE RIGHT ANTERIOR COMPARTMENT FASCIOTOMY ;  Surgeon: Loreta Aveaniel F Murphy, MD;  Location: [facility];  Service: Orthopedics;  Laterality: Right;  . KNEE ARTHROSCOPY W/ PLICA EXCISION Left 07/12/2010  . KNEE ARTHROSCOPY WITH FULKERSON SLIDE Right 10/30/2013   Procedure: RIGHT KNEE SCOPE LATERAL RELEASE/CHONDROPLASTY ANTERIOR TIBIAL TUBERCLEPLASTY;  Surgeon: Loreta Aveaniel F Murphy, MD;   Location: [facility];  Service: Orthopedics;  Laterality: Right;  ANESTHESIA:  GENERAL, PRE/POST OP FEMORAL NERVE  . KNEE ARTHROSCOPY WITH LATERAL RELEASE Left 07/24/2013   Procedure: LEFT ARTHROSCOPY KNEE WITH LATERAL RELEASE,WITH DEBRIDEMENT/SHAVING (CHONDROPLASTY) ANTERIOR TIBIAL TUBERCLEPLASTY;  Surgeon: Loreta Aveaniel F Murphy, MD;  Location: [facility];  Service: Orthopedics;  Laterality: Left;    OB History    No data available       Home Medications    Prior to Admission medications   Medication Sig Start Date End Date Taking? Authorizing Provider  ondansetron (ZOFRAN) 4 MG tablet Take 1 tablet (4 mg total) by mouth every 8 (eight) hours as needed for nausea or vomiting. 05/21/15  Yes Nilda RiggsNancy Carolyn Martin, NP  rizatriptan (MAXALT-MLT) 10 MG disintegrating tablet Take 1 tablet (10 mg total) by mouth as needed for migraine. May repeat in 2 hours if needed 05/21/15  Yes Nilda RiggsNancy Carolyn Martin, NP  zonisamide (ZONEGRAN) 50 MG capsule Take 1 capsule (50 mg total) by mouth 2 (two) times daily. 05/21/15  Yes Nilda RiggsNancy Carolyn Martin, NP  ALPRAZolam Prudy Feeler(XANAX) 0.5 MG tablet Take 0.5 mg by mouth 2 (two) times daily as needed.  03/06/14   Historical Provider, MD    Family History Family History  Problem Relation Age of Onset  . Thyroid disease Mother   . Diabetes Paternal Grandmother     Social History Social History  Substance Use Topics  . Smoking status: Never Smoker  . Smokeless tobacco: Never Used  . Alcohol use Yes     Comment: occasionally  Allergies   Topamax [topiramate]   Review of Systems Review of Systems  Constitutional: Negative for chills and fever.  HENT: Negative for congestion.   Eyes: Negative for photophobia and visual disturbance.  Respiratory: Negative for cough and shortness of breath.   Cardiovascular: Negative.   Gastrointestinal: Negative for abdominal pain, nausea and vomiting.  Genitourinary: Negative for dysuria.    Musculoskeletal: Positive for back pain and neck pain.  Skin: Negative for rash.  Neurological: Positive for numbness and headaches. Negative for dizziness, syncope, facial asymmetry and speech difficulty.     Physical Exam Updated Vital Signs BP 115/76 (BP Location: Left Arm)   Pulse 82   Temp 97.7 F (36.5 C) (Oral)   Resp 18   Ht 5\' 7"  (1.702 m)   Wt 81.6 kg   LMP 10/17/2015   SpO2 100%   BMI 28.19 kg/m   Physical Exam  Constitutional: She is oriented to person, place, and time. She appears well-developed and well-nourished. No distress.  HENT:  Head: Normocephalic and atraumatic.  Mouth/Throat: Oropharynx is clear and moist.  No tenderness of the temporal artery   Eyes: Conjunctivae and EOM are normal. Pupils are equal, round, and reactive to light. No scleral icterus.  No nystagmus   Neck: Normal range of motion. Neck supple.  Full active and passive ROM. No midline tenderness.  TTP of right paraspinal and trapezius musculature.  No nuchal rigidity or meningeal signs.  Cardiovascular: Normal rate, regular rhythm, normal heart sounds and intact distal pulses.   Pulmonary/Chest: Effort normal and breath sounds normal. No respiratory distress. She has no wheezes. She has no rales.  Abdominal: Soft. Bowel sounds are normal. There is no tenderness. There is no rebound and no guarding.  Musculoskeletal: Normal range of motion.  5/5 muscle strength of upper and lower extremities bilaterally including strong and equal grip strength and plantar/dorsiflexion.   Lymphadenopathy:    She has no cervical adenopathy.  Neurological: She is alert and oriented to person, place, and time. She has normal reflexes. No cranial nerve deficit. Coordination normal.  Mental Status: Alert, oriented, and thought content is appropriate. Speech is fluent without evidence of aphasia. Able to follow two-step commands without difficulty.  Cranial Nerves:  II - Peripheral visual fields grossly normal,  pupils equal, round, reactive to light III, IV, VI - Bilateral EOM intact, no ptosis V - Patient endorses decreased sensation in V2 nerve distribution bilaterally. Intact and equal sensation in V1 and V3 VII - Facial symmetry: smile, raised eyebrows ; Eyelids kept closed against resistance VIII - Hearing grossly normal bilaterally  IX, X - Uvula midline XI - Bilateral shoulder shrug equal and strong XII - Tongue extension midline  Skin: Skin is warm and dry. No rash noted. She is not diaphoretic.  Psychiatric: She has a normal mood and affect. Her behavior is normal. Judgment and thought content normal.  Nursing note and vitals reviewed.    ED Treatments / Results  Labs (all labs ordered are listed, but only abnormal results are displayed) Labs Reviewed - No data to display  EKG  EKG Interpretation None       Radiology No results found.  Procedures Procedures (including critical care time)  Medications Ordered in ED Medications  sodium chloride 0.9 % bolus 1,000 mL (0 mLs Intravenous Stopped 10/19/15 1551)  ketorolac (TORADOL) 30 MG/ML injection 30 mg (30 mg Intravenous Given 10/19/15 1504)  prochlorperazine (COMPAZINE) injection 10 mg (10 mg Intravenous Given 10/19/15 1504)  diphenhydrAMINE (BENADRYL)  injection 25 mg (25 mg Intravenous Given 10/19/15 1504)  diazepam (VALIUM) injection 2 mg (2 mg Intravenous Given 10/19/15 1608)  dexamethasone (DECADRON) injection 10 mg (10 mg Intravenous Given 10/19/15 1603)     Initial Impression / Assessment and Plan / ED Course  I have reviewed the triage vital signs and the nursing notes.  Pertinent labs & imaging results that were available during my care of the patient were reviewed by me and considered in my medical decision making (see chart for details).  Clinical Course   Jackie [name] is a 25 y.o. female history of migraines who presents to ED for migraine headache. No meningeal signs on exam. Afebrile. She endorses  subjective decreased sensation in the V2 nerve distribution, but otherwise normal neuro exam. Migraine cocktail given in ED patient had noticeable improvement in symptoms. She is now requesting discharge to home. She is followed by neurology and I have strongly encouraged her to follow up with her neurologist in regards to her visit today. Evaluation does not show pathology that would require ongoing emergent intervention or inpatient treatment. Patient is hemodynamically stable and mentating appropriately. Discussed findings and plan with patient who agrees with treatment plan as dictated. Return precautions discussed and all questions answered.    Patient seen by and discussed with Dr. Erma Heritage who agrees with treatment plan.   Final Clinical Impressions(s) / ED Diagnoses   Final diagnoses:  Migraine without status migrainosus, not intractable, unspecified migraine type    New Prescriptions New Prescriptions   No medications on file     [facility] Kenlie Seki, PA-C 10/19/15 1654    Shaune Pollack, MD 10/20/15 0700

## 2015-10-19 NOTE — ED Triage Notes (Signed)
Headache x 3 days. States pain is worse when she moves her neck. Denies fever. States she just feels lethargic. Drove herself here.

## 2015-10-19 NOTE — Discharge Instructions (Signed)
Please follow up with your neurologist for discussion of today's ER visit.  Return to ER for new or worsening symptoms, any additional concerns.

## 2015-10-22 ENCOUNTER — Ambulatory Visit (INDEPENDENT_AMBULATORY_CARE_PROVIDER_SITE_OTHER): Payer: BLUE CROSS/BLUE SHIELD | Admitting: Nurse Practitioner

## 2015-10-22 ENCOUNTER — Encounter: Payer: Self-pay | Admitting: Nurse Practitioner

## 2015-10-22 VITALS — BP 105/73 | HR 72 | Ht 67.0 in | Wt 185.4 lb

## 2015-10-22 DIAGNOSIS — G43909 Migraine, unspecified, not intractable, without status migrainosus: Secondary | ICD-10-CM | POA: Diagnosis not present

## 2015-10-22 DIAGNOSIS — Z8739 Personal history of other diseases of the musculoskeletal system and connective tissue: Secondary | ICD-10-CM | POA: Diagnosis not present

## 2015-10-22 MED ORDER — SUMATRIPTAN SUCCINATE 100 MG PO TABS: 100.0000 mg | ORAL_TABLET | ORAL | 2 refills | Status: DC | PRN

## 2015-10-22 MED ORDER — ZONISAMIDE 100 MG PO CAPS: 100.0000 mg | ORAL_CAPSULE | Freq: Two times a day (BID) | ORAL | 6 refills | Status: DC
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
## 2015-10-22 NOTE — Patient Instructions (Signed)
Change Maxalt to Imitrex for acute migraine Increase Zonogran  To 100mg   twice daily Keep a record of your headaches on the migraine tracker app Follow-up in 2 months

## 2015-10-26 DIAGNOSIS — G43909 Migraine, unspecified, not intractable, without status migrainosus: Secondary | ICD-10-CM | POA: Diagnosis not present

## 2015-11-19 ENCOUNTER — Ambulatory Visit: Payer: BLUE CROSS/BLUE SHIELD | Admitting: Nurse Practitioner

## 2015-12-22 ENCOUNTER — Encounter: Payer: Self-pay | Admitting: Nurse Practitioner

## 2015-12-22 ENCOUNTER — Ambulatory Visit (INDEPENDENT_AMBULATORY_CARE_PROVIDER_SITE_OTHER): Payer: BLUE CROSS/BLUE SHIELD | Admitting: Nurse Practitioner

## 2015-12-22 VITALS — BP 110/76 | HR 86 | Resp 20 | Ht 67.0 in | Wt 179.0 lb

## 2015-12-22 DIAGNOSIS — G43909 Migraine, unspecified, not intractable, without status migrainosus: Secondary | ICD-10-CM

## 2015-12-22 MED ORDER — ZONISAMIDE 100 MG PO CAPS: 300.0000 mg | ORAL_CAPSULE | Freq: Every day | ORAL | 3 refills | Status: DC

## 2015-12-22 NOTE — Patient Instructions (Signed)
Continue Imitrex acutely Increase Zonogran to 300 mg at night will refill Continue to keep a record of your headaches Follow-up in 3 months
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
## 2015-12-22 NOTE — Progress Notes (Signed)
I have reviewed and agreed above plan. 

## 2015-12-24 DIAGNOSIS — J039 Acute tonsillitis, unspecified: Secondary | ICD-10-CM | POA: Diagnosis not present

## 2015-12-29 DIAGNOSIS — Z0000 Encounter for general adult medical examination without abnormal findings: Secondary | ICD-10-CM | POA: Diagnosis not present

## 2016-03-24 ENCOUNTER — Ambulatory Visit: Payer: BLUE CROSS/BLUE SHIELD | Admitting: Nurse Practitioner

## 2016-03-27 ENCOUNTER — Encounter: Payer: Self-pay | Admitting: Nurse Practitioner

## 2016-05-11 DIAGNOSIS — Z30432 Encounter for removal of intrauterine contraceptive device: Secondary | ICD-10-CM | POA: Diagnosis not present

## 2016-07-10 DIAGNOSIS — O208 Other hemorrhage in early pregnancy: Secondary | ICD-10-CM | POA: Diagnosis not present

## 2016-07-13 DIAGNOSIS — O208 Other hemorrhage in early pregnancy: Secondary | ICD-10-CM | POA: Diagnosis not present

## 2016-07-19 DIAGNOSIS — O209 Hemorrhage in early pregnancy, unspecified: Secondary | ICD-10-CM | POA: Diagnosis not present

## 2016-07-21 ENCOUNTER — Ambulatory Visit (HOSPITAL_COMMUNITY)
Admission: RE | Admit: 2016-07-21 | Discharge: 2016-07-21 | Disposition: A | Discharge status: Home / Self Care | Payer: BLUE CROSS/BLUE SHIELD | Source: Ambulatory Visit | Attending: Obstetrics and Gynecology | Admitting: Obstetrics and Gynecology

## 2016-07-21 ENCOUNTER — Other Ambulatory Visit: Payer: Self-pay | Admitting: Obstetrics and Gynecology

## 2016-07-21 DIAGNOSIS — O209 Hemorrhage in early pregnancy, unspecified: Secondary | ICD-10-CM

## 2016-07-21 DIAGNOSIS — Z3A01 Less than 8 weeks gestation of pregnancy: Secondary | ICD-10-CM | POA: Diagnosis not present

## 2016-07-31 DIAGNOSIS — R946 Abnormal results of thyroid function studies: Secondary | ICD-10-CM | POA: Diagnosis not present

## 2016-07-31 DIAGNOSIS — Z3400 Encounter for supervision of normal first pregnancy, unspecified trimester: Secondary | ICD-10-CM | POA: Diagnosis not present

## 2016-07-31 DIAGNOSIS — Z3401 Encounter for supervision of normal first pregnancy, first trimester: Secondary | ICD-10-CM | POA: Diagnosis not present

## 2016-08-07 DIAGNOSIS — Z3401 Encounter for supervision of normal first pregnancy, first trimester: Secondary | ICD-10-CM | POA: Diagnosis not present

## 2016-08-07 DIAGNOSIS — O200 Threatened abortion: Secondary | ICD-10-CM | POA: Diagnosis not present

## 2016-08-07 DIAGNOSIS — Z3A01 Less than 8 weeks gestation of pregnancy: Secondary | ICD-10-CM | POA: Diagnosis not present

## 2016-08-07 DIAGNOSIS — O99281 Endocrine, nutritional and metabolic diseases complicating pregnancy, first trimester: Secondary | ICD-10-CM | POA: Diagnosis not present

## 2016-08-18 DIAGNOSIS — O99281 Endocrine, nutritional and metabolic diseases complicating pregnancy, first trimester: Secondary | ICD-10-CM | POA: Diagnosis not present

## 2016-08-18 DIAGNOSIS — Z3401 Encounter for supervision of normal first pregnancy, first trimester: Secondary | ICD-10-CM | POA: Diagnosis not present

## 2016-08-18 DIAGNOSIS — N939 Abnormal uterine and vaginal bleeding, unspecified: Secondary | ICD-10-CM | POA: Diagnosis not present

## 2016-08-24 ENCOUNTER — Ambulatory Visit (INDEPENDENT_AMBULATORY_CARE_PROVIDER_SITE_OTHER): Payer: BLUE CROSS/BLUE SHIELD | Admitting: Endocrinology

## 2016-08-24 ENCOUNTER — Encounter: Payer: Self-pay | Admitting: Endocrinology

## 2016-08-24 DIAGNOSIS — E069 Thyroiditis, unspecified: Secondary | ICD-10-CM | POA: Diagnosis not present

## 2016-08-24 NOTE — Progress Notes (Signed)
Subjective:    Patient ID: Jackie [name], female    DOB: February 12, 1990, 26 y.o.   MRN: [id number]  HPI Pt is referred by Dr Katrinka Blazing, for thyroiditis.  Pt reports thyroiditis was dx'ed 2 weeks ago.  she has been on prescribed thyroid hormone therapy since then.  she has never taken kelp or any other type of non-prescribed thyroid product.  she has never had thyroid imaging.  she has never had thyroid surgery, or XRT to the neck.  He has never been on amiodarone or lithium.  She is now [redacted] weeks pregnant. She has slight hair loss, and assoc fatigue.  Since on the medication, she feels no different.   Past Medical History:  Diagnosis Date  . Chondromalacia of right patella 10/2013  . Compartment syndrome, nontraumatic, lower extremity 10/2013   right  . Migraines     Past Surgical History:  Procedure Laterality Date  . FASCIOTOMY Right 10/30/2013   Procedure: RELEASE RIGHT ANTERIOR COMPARTMENT FASCIOTOMY ;  Surgeon: Loreta Ave, MD;  Location: [facility];  Service: Orthopedics;  Laterality: Right;  . KNEE ARTHROSCOPY W/ PLICA EXCISION Left 07/12/2010  . KNEE ARTHROSCOPY WITH FULKERSON SLIDE Right 10/30/2013   Procedure: RIGHT KNEE SCOPE LATERAL RELEASE/CHONDROPLASTY ANTERIOR TIBIAL TUBERCLEPLASTY;  Surgeon: Loreta Ave, MD;  Location: [facility];  Service: Orthopedics;  Laterality: Right;  ANESTHESIA:  GENERAL, PRE/POST OP FEMORAL NERVE  . KNEE ARTHROSCOPY WITH LATERAL RELEASE Left 07/24/2013   Procedure: LEFT ARTHROSCOPY KNEE WITH LATERAL RELEASE,WITH DEBRIDEMENT/SHAVING (CHONDROPLASTY) ANTERIOR TIBIAL TUBERCLEPLASTY;  Surgeon: Loreta Ave, MD;  Location: [facility];  Service: Orthopedics;  Laterality: Left;    Social History   Social History  . Marital status: Single    Spouse name: N/A  . Number of children: 0  . Years of education: 14   Occupational History  . [employer] Student   Social History Main Topics  . Smoking status: Never  Smoker  . Smokeless tobacco: Never Used  . Alcohol use Yes     Comment: occasionally  . Drug use: No  . Sexual activity: Not on file   Other Topics Concern  . Not on file   Social History Narrative   Patient is single and lives with a roommate.   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   Patient has a college education.   Patient is right-handed.   Patient drinks very little caffeine.    No current outpatient prescriptions on file prior to visit.   No current facility-administered medications on file prior to visit.     Allergies  Allergen Reactions  . Topamax [Topiramate] Other (See Comments)    NUMBNESS OF HANDS AND FACE    Family History  Problem Relation Age of Onset  . Thyroid disease Mother   . Diabetes Paternal Grandmother     BP 122/70   Pulse 81   Ht 5\' 7"  (1.702 m)   Wt 186 lb (84.4 kg)   SpO2 97%   BMI 29.13 kg/m    Review of Systems  denies depression, muscle cramps, sob, fever, difficulty with concentration, constipation, numbness, blurry vision, cold intolerance, n/v, myalgias, dry skin, rhinorrhea, easy bruising, and syncope.     Objective:   Physical Exam VS: see vs page GEN: no distress HEAD: head: no deformity eyes: no periorbital swelling, no proptosis external nose and ears are normal mouth: no lesion seen NECK: supple, thyroid is not enlarged CHEST WALL: no deformity LUNGS: clear  to auscultation CV: reg rate and rhythm, no murmur ABD: abdomen is soft, nontender.  no hepatosplenomegaly.  not distended.  no hernia MUSCULOSKELETAL: muscle bulk and strength are grossly normal.  no obvious joint swelling.  gait is normal and steady EXTEMITIES: no deformity.  no ulcer on the feet.  feet are of normal color and temp.  no edema PULSES: dorsalis pedis intact bilat.  no carotid bruit NEURO:  cn 2-12 grossly intact.   readily moves all 4's.  sensation is intact to touch on the feet SKIN:  Normal texture and temperature.  No rash or suspicious lesion is  visible.   NODES:  None palpable at the neck PSYCH: alert, well-oriented.  Does not appear anxious nor depressed.    I have reviewed outside records, and summarized: Pt was noted to have elevated TSH, and referred here. She is in the first few week of pregnancy.  Despite normal TSH, she was rx'ed synthroid due to pregnancy.    outside test results are reviewed: TSH=2 TPO Ab=neg Anti-thyr Ab is mildly pos     Assessment & Plan:  Thyroiditis, new to me.  She has high risk for developing hypothyroidism Pregnancy: in this context, we need to err on the side of overeplacement rather than untreated hypothyroidism.   Patient Instructions  Please continue the same medication for now.  Please redo the blood tests next week, as scheduled.  Please ask that a copy be sent here.   Please come back for a follow-up appointment in 3-4 months.      Hypothyroidism and Pregnancy Hypothyroidism is a condition that develops if you have an underactive thyroid gland. The thyroid is a small, butterfly-shaped gland in your neck, and it is located in front of your windpipe. It makes the thyroid hormones triiodothyronine (T3) and thyroxine (T4). These hormones play an important role in regulating your breathing, heart rate, menstrual cycle, body temperature, and other bodily functions. If you have hypothyroidism, your thyroid gland does not produce enough thyroid hormones. When you are pregnant, your body uses more thyroid hormones. This can cause mild hypothyroidism to get worse. Hypothyroidism during pregnancy can lead to complications, including high blood pressure that develops after the 20th week of pregnancy (preeclampsia). Hypothyroidism can also affect your baby. Babies need thyroid hormone from their mothers for normal growth and brain development. Babies born to mothers with hypothyroidism during pregnancy may be born prematurely and have lower mental abilities. What are the signs or symptoms? Symptoms  of hypothyroidism can develop slowly. Symptoms include:  Fatigue.  Weight gain.  Constipation.  Feeling cold more often than others do.  Muscle aches.  How is this diagnosed? Your health care provider may suspect hypothyroidism based on your symptoms. The health care provider will also do a physical exam to check your neck. He or she will do this while you swallow so that it will be easier to feel your thyroid gland. You may also have tests to confirm the diagnosis, including:  Blood tests.  An imaging study using sound waves and a computer (ultrasound).  How is this treated? Hypothyroid treatment during pregnancy includes:  Monitoring. If you have mild hypothyroidism, your health care provider will monitor your thyroid hormone levels closely to watch for any changes.  Medicine prescribed by your health care provider to control your thyroid hormone levels.  Follow these instructions at home:  Take medicines only as directed by your health care provider. Check with your health care provider before taking any hypothyroid  medicines that were prescribed before you became pregnant. Many are safe, but some treatments for hypothyroidism may have to be stopped during pregnancy.  Some women need extra iodine during pregnancy. Ask your health care provider whether you should: ? Get more iodine in your diet. ? Take a prenatal vitamin containing iodine. ? Take iodine supplements. Contact a health care provider if:  You notice the onset of hypothyroidism symptoms that you did not have before.  You gain more than 5 lb (2.3 kg) in 1 week. For women at a normal weight, it is normal to gain about 1 pound per week during pregnancy.  You have a lump in your neck.  You have a scratchy throat or difficulty speaking that lasts longer than a month and is not related to a cold.  You have a hard time swallowing. Get help right away if:  Your baby is less active than normal. You may be asked to  perform kick counts to monitor your baby's movements. If your baby moves fewer than 10 times in 2 hours during a period when the baby is usually active (typically in the evening), you should see your health care provider right away.  Your baby stops moving completely.  You develop muscle cramps.  You have belly pain.  You have heavy bleeding.  You develop a fever or chills.  You have a very bad headache or vision problems.  You develop swelling in your legs and ankles. This information is not intended to replace advice given to you by your health care provider. Make sure you discuss any questions you have with your health care provider. Document Released: 10/23/2006 Document Revised: 06/03/2015 Document Reviewed: 05/28/2013 Elsevier Interactive Patient Education  2018 [employer].

## 2016-08-24 NOTE — Patient Instructions (Addendum)
Please continue the same medication for now.  Please redo the blood tests next week, as scheduled.  Please ask that a copy be sent here.   Please come back for a follow-up appointment in 3-4 months.      Hypothyroidism and Pregnancy Hypothyroidism is a condition that develops if you have an underactive thyroid gland. The thyroid is a small, butterfly-shaped gland in your neck, and it is located in front of your windpipe. It makes the thyroid hormones triiodothyronine (T3) and thyroxine (T4). These hormones play an important role in regulating your breathing, heart rate, menstrual cycle, body temperature, and other bodily functions. If you have hypothyroidism, your thyroid gland does not produce enough thyroid hormones. When you are pregnant, your body uses more thyroid hormones. This can cause mild hypothyroidism to get worse. Hypothyroidism during pregnancy can lead to complications, including high blood pressure that develops after the 20th week of pregnancy (preeclampsia). Hypothyroidism can also affect your baby. Babies need thyroid hormone from their mothers for normal growth and brain development. Babies born to mothers with hypothyroidism during pregnancy may be born prematurely and have lower mental abilities. What are the signs or symptoms? Symptoms of hypothyroidism can develop slowly. Symptoms include:  Fatigue.  Weight gain.  Constipation.  Feeling cold more often than others do.  Muscle aches.  How is this diagnosed? Your health care provider may suspect hypothyroidism based on your symptoms. The health care provider will also do a physical exam to check your neck. He or she will do this while you swallow so that it will be easier to feel your thyroid gland. You may also have tests to confirm the diagnosis, including:  Blood tests.  An imaging study using sound waves and a computer (ultrasound).  How is this treated? Hypothyroid treatment during pregnancy  includes:  Monitoring. If you have mild hypothyroidism, your health care provider will monitor your thyroid hormone levels closely to watch for any changes.  Medicine prescribed by your health care provider to control your thyroid hormone levels.  Follow these instructions at home:  Take medicines only as directed by your health care provider. Check with your health care provider before taking any hypothyroid medicines that were prescribed before you became pregnant. Many are safe, but some treatments for hypothyroidism may have to be stopped during pregnancy.  Some women need extra iodine during pregnancy. Ask your health care provider whether you should: ? Get more iodine in your diet. ? Take a prenatal vitamin containing iodine. ? Take iodine supplements. Contact a health care provider if:  You notice the onset of hypothyroidism symptoms that you did not have before.  You gain more than 5 lb (2.3 kg) in 1 week. For women at a normal weight, it is normal to gain about 1 pound per week during pregnancy.  You have a lump in your neck.  You have a scratchy throat or difficulty speaking that lasts longer than a month and is not related to a cold.  You have a hard time swallowing. Get help right away if:  Your baby is less active than normal. You may be asked to perform kick counts to monitor your baby's movements. If your baby moves fewer than 10 times in 2 hours during a period when the baby is usually active (typically in the evening), you should see your health care provider right away.  Your baby stops moving completely.  You develop muscle cramps.  You have belly pain.  You have heavy bleeding.  You develop a fever or chills.  You have a very bad headache or vision problems.  You develop swelling in your legs and ankles. This information is not intended to replace advice given to you by your health care provider. Make sure you discuss any questions you have with your  health care provider. Document Released: 10/23/2006 Document Revised: 06/03/2015 Document Reviewed: 05/28/2013 Elsevier Interactive Patient Education  2018 [employer].

## 2016-08-26 DIAGNOSIS — E069 Thyroiditis, unspecified: Secondary | ICD-10-CM | POA: Insufficient documentation

## 2016-08-28 DIAGNOSIS — E039 Hypothyroidism, unspecified: Secondary | ICD-10-CM | POA: Diagnosis not present

## 2016-08-28 DIAGNOSIS — O99281 Endocrine, nutritional and metabolic diseases complicating pregnancy, first trimester: Secondary | ICD-10-CM | POA: Diagnosis not present

## 2016-08-28 DIAGNOSIS — Z3401 Encounter for supervision of normal first pregnancy, first trimester: Secondary | ICD-10-CM | POA: Diagnosis not present

## 2016-09-04 DIAGNOSIS — G43909 Migraine, unspecified, not intractable, without status migrainosus: Secondary | ICD-10-CM | POA: Diagnosis not present

## 2016-09-04 DIAGNOSIS — Z6829 Body mass index (BMI) 29.0-29.9, adult: Secondary | ICD-10-CM | POA: Diagnosis not present

## 2016-09-04 DIAGNOSIS — O99281 Endocrine, nutritional and metabolic diseases complicating pregnancy, first trimester: Secondary | ICD-10-CM | POA: Diagnosis not present

## 2016-09-04 DIAGNOSIS — Z3A11 11 weeks gestation of pregnancy: Secondary | ICD-10-CM | POA: Diagnosis not present

## 2016-09-04 LAB — OB RESULTS CONSOLE RUBELLA ANTIBODY, IGM: Rubella: NON-IMMUNE/NOT IMMUNE

## 2016-09-04 LAB — OB RESULTS CONSOLE HEPATITIS B SURFACE ANTIGEN: Hepatitis B Surface Ag: NEGATIVE

## 2016-09-04 LAB — OB RESULTS CONSOLE HIV ANTIBODY (ROUTINE TESTING): HIV: NONREACTIVE

## 2016-09-04 LAB — OB RESULTS CONSOLE GC/CHLAMYDIA
Chlamydia: NEGATIVE
Gonorrhea: NEGATIVE

## 2016-09-04 LAB — OB RESULTS CONSOLE ABO/RH: RH TYPE: POSITIVE

## 2016-09-04 LAB — OB RESULTS CONSOLE ANTIBODY SCREEN: ANTIBODY SCREEN: NEGATIVE

## 2016-09-04 LAB — OB RESULTS CONSOLE RPR: RPR: NONREACTIVE

## 2016-09-07 DIAGNOSIS — Z36 Encounter for antenatal screening for chromosomal anomalies: Secondary | ICD-10-CM | POA: Diagnosis not present

## 2016-09-07 DIAGNOSIS — O99281 Endocrine, nutritional and metabolic diseases complicating pregnancy, first trimester: Secondary | ICD-10-CM | POA: Diagnosis not present

## 2016-09-07 DIAGNOSIS — Z3A12 12 weeks gestation of pregnancy: Secondary | ICD-10-CM | POA: Diagnosis not present

## 2016-09-07 DIAGNOSIS — Z3682 Encounter for antenatal screening for nuchal translucency: Secondary | ICD-10-CM | POA: Diagnosis not present

## 2016-09-07 DIAGNOSIS — Z3491 Encounter for supervision of normal pregnancy, unspecified, first trimester: Secondary | ICD-10-CM | POA: Diagnosis not present

## 2016-10-02 DIAGNOSIS — O99282 Endocrine, nutritional and metabolic diseases complicating pregnancy, second trimester: Secondary | ICD-10-CM | POA: Diagnosis not present

## 2016-10-02 DIAGNOSIS — Z361 Encounter for antenatal screening for raised alphafetoprotein level: Secondary | ICD-10-CM | POA: Diagnosis not present

## 2016-10-02 DIAGNOSIS — Z3A15 15 weeks gestation of pregnancy: Secondary | ICD-10-CM | POA: Diagnosis not present

## 2016-10-23 DIAGNOSIS — O99282 Endocrine, nutritional and metabolic diseases complicating pregnancy, second trimester: Secondary | ICD-10-CM | POA: Diagnosis not present

## 2016-10-23 DIAGNOSIS — Z3A18 18 weeks gestation of pregnancy: Secondary | ICD-10-CM | POA: Diagnosis not present

## 2016-11-20 DIAGNOSIS — O99282 Endocrine, nutritional and metabolic diseases complicating pregnancy, second trimester: Secondary | ICD-10-CM | POA: Diagnosis not present

## 2016-11-20 DIAGNOSIS — Z3A22 22 weeks gestation of pregnancy: Secondary | ICD-10-CM | POA: Diagnosis not present

## 2016-11-27 DIAGNOSIS — O4692 Antepartum hemorrhage, unspecified, second trimester: Secondary | ICD-10-CM | POA: Diagnosis not present

## 2016-11-27 DIAGNOSIS — Z3A23 23 weeks gestation of pregnancy: Secondary | ICD-10-CM | POA: Diagnosis not present

## 2016-12-11 DIAGNOSIS — O99282 Endocrine, nutritional and metabolic diseases complicating pregnancy, second trimester: Secondary | ICD-10-CM | POA: Diagnosis not present

## 2016-12-11 DIAGNOSIS — O99280 Endocrine, nutritional and metabolic diseases complicating pregnancy, unspecified trimester: Secondary | ICD-10-CM | POA: Diagnosis not present

## 2016-12-11 DIAGNOSIS — Z3689 Encounter for other specified antenatal screening: Secondary | ICD-10-CM | POA: Diagnosis not present

## 2016-12-11 DIAGNOSIS — Z3A25 25 weeks gestation of pregnancy: Secondary | ICD-10-CM | POA: Diagnosis not present

## 2016-12-22 ENCOUNTER — Ambulatory Visit: Payer: BLUE CROSS/BLUE SHIELD | Admitting: Endocrinology

## 2017-01-05 DIAGNOSIS — Z3A29 29 weeks gestation of pregnancy: Secondary | ICD-10-CM | POA: Diagnosis not present

## 2017-01-05 DIAGNOSIS — Z3689 Encounter for other specified antenatal screening: Secondary | ICD-10-CM | POA: Diagnosis not present

## 2017-01-05 DIAGNOSIS — Z23 Encounter for immunization: Secondary | ICD-10-CM | POA: Diagnosis not present

## 2017-01-05 DIAGNOSIS — O99280 Endocrine, nutritional and metabolic diseases complicating pregnancy, unspecified trimester: Secondary | ICD-10-CM | POA: Diagnosis not present

## 2017-01-09 NOTE — L&D Delivery Note (Signed)
Delivery Note At  a viable and healthy  female was delivered over intact perineum via svd (Presentation: ; LOA ).  APGAR: ,9/9 ; weight  . Not yet done  Placenta status: delivered intact and spontaneously, .  Cord: 3 vv,  with the following complications: end.  Anesthesia:  epidural Episiotomy:  none Lacerations:  vaginal, bilateral labial Suture Repair: 3.0 vicryl Est. Blood Loss (mL):  300ml  Mom to postpartum.  Baby to Couplet care / Skin to Skin.  Vick FreesSusan E Almquist 03/14/2017, 12:06 PM

## 2017-01-22 DIAGNOSIS — Z3A31 31 weeks gestation of pregnancy: Secondary | ICD-10-CM | POA: Diagnosis not present

## 2017-01-22 DIAGNOSIS — O99280 Endocrine, nutritional and metabolic diseases complicating pregnancy, unspecified trimester: Secondary | ICD-10-CM | POA: Diagnosis not present

## 2017-02-05 DIAGNOSIS — R05 Cough: Secondary | ICD-10-CM | POA: Diagnosis not present

## 2017-02-05 DIAGNOSIS — J029 Acute pharyngitis, unspecified: Secondary | ICD-10-CM | POA: Diagnosis not present

## 2017-02-05 DIAGNOSIS — Z3A33 33 weeks gestation of pregnancy: Secondary | ICD-10-CM | POA: Diagnosis not present

## 2017-02-05 DIAGNOSIS — O99280 Endocrine, nutritional and metabolic diseases complicating pregnancy, unspecified trimester: Secondary | ICD-10-CM | POA: Diagnosis not present

## 2017-02-15 ENCOUNTER — Other Ambulatory Visit: Payer: Self-pay

## 2017-02-15 ENCOUNTER — Inpatient Hospital Stay (HOSPITAL_COMMUNITY)
Admission: AD | Admit: 2017-02-15 | Discharge: 2017-02-15 | Disposition: A | Discharge status: Home / Self Care | Payer: BLUE CROSS/BLUE SHIELD | Source: Ambulatory Visit | Attending: Obstetrics and Gynecology | Admitting: Obstetrics and Gynecology

## 2017-02-15 ENCOUNTER — Encounter (HOSPITAL_COMMUNITY): Payer: Self-pay | Admitting: *Deleted

## 2017-02-15 DIAGNOSIS — O23593 Infection of other part of genital tract in pregnancy, third trimester: Secondary | ICD-10-CM | POA: Diagnosis not present

## 2017-02-15 DIAGNOSIS — N898 Other specified noninflammatory disorders of vagina: Secondary | ICD-10-CM | POA: Diagnosis not present

## 2017-02-15 DIAGNOSIS — O4703 False labor before 37 completed weeks of gestation, third trimester: Secondary | ICD-10-CM | POA: Diagnosis not present

## 2017-02-15 DIAGNOSIS — O26893 Other specified pregnancy related conditions, third trimester: Secondary | ICD-10-CM | POA: Diagnosis not present

## 2017-02-15 DIAGNOSIS — B373 Candidiasis of vulva and vagina: Secondary | ICD-10-CM | POA: Diagnosis not present

## 2017-02-15 DIAGNOSIS — Z3A35 35 weeks gestation of pregnancy: Secondary | ICD-10-CM | POA: Diagnosis not present

## 2017-02-15 DIAGNOSIS — B3731 Acute candidiasis of vulva and vagina: Secondary | ICD-10-CM

## 2017-02-15 DIAGNOSIS — Z0371 Encounter for suspected problem with amniotic cavity and membrane ruled out: Secondary | ICD-10-CM

## 2017-02-15 LAB — URINALYSIS, ROUTINE W REFLEX MICROSCOPIC
BILIRUBIN URINE: NEGATIVE
Glucose, UA: NEGATIVE mg/dL
HGB URINE DIPSTICK: NEGATIVE
Ketones, ur: NEGATIVE mg/dL
NITRITE: NEGATIVE
PROTEIN: NEGATIVE mg/dL
Specific Gravity, Urine: 1.019 (ref 1.005–1.030)
pH: 7 (ref 5.0–8.0)

## 2017-02-15 LAB — WET PREP, GENITAL
Clue Cells Wet Prep HPF POC: NONE SEEN
SPERM: NONE SEEN
Trich, Wet Prep: NONE SEEN

## 2017-02-15 LAB — POCT FERN TEST: POCT Fern Test: NEGATIVE

## 2017-02-15 MED ORDER — TERCONAZOLE 0.8 % VA CREA: 1.0000 | TOPICAL_CREAM | Freq: Every day | VAGINAL | 0 refills | Status: DC

## 2017-02-15 NOTE — MAU Provider Note (Signed)
History     CSN: [id number]  Arrival date and time: 02/15/17 1704   First Provider Initiated Contact with Patient 02/15/17 1741      Chief Complaint  Patient presents with  . Contractions  . Rupture of Membranes   HPI  Adeli L Matsumura is a 27 y.o. G1P0 at [id number] who presents with contractions and LOF. Symptoms began last week. Reports sporadic contractions throughout the day that increased in intensity today. Also reports feeling like she's leaking fluid when she has contractions. Leaking has become thinner but also has some discharge that is clumpy and feels like she may have a yeast infection. Denies vaginal bleeding, dysuria, or vaginal irritation. Positive fetal movement.   OB History    Gravida Para Term Preterm AB Living   1             SAB TAB Ectopic Multiple Live Births                  Past Medical History:  Diagnosis Date  . Chondromalacia of right patella 10/2013  . Compartment syndrome, nontraumatic, lower extremity 10/2013   right  . Migraines     Past Surgical History:  Procedure Laterality Date  . FASCIOTOMY Right 10/30/2013   Procedure: RELEASE RIGHT ANTERIOR COMPARTMENT FASCIOTOMY ;  Surgeon: Loreta Aveaniel F Murphy, MD;  Location: [facility];  Service: Orthopedics;  Laterality: Right;  . KNEE ARTHROSCOPY W/ PLICA EXCISION Left 07/12/2010  . KNEE ARTHROSCOPY WITH FULKERSON SLIDE Right 10/30/2013   Procedure: RIGHT KNEE SCOPE LATERAL RELEASE/CHONDROPLASTY ANTERIOR TIBIAL TUBERCLEPLASTY;  Surgeon: Loreta Aveaniel F Murphy, MD;  Location: [facility];  Service: Orthopedics;  Laterality: Right;  ANESTHESIA:  GENERAL, PRE/POST OP FEMORAL NERVE  . KNEE ARTHROSCOPY WITH LATERAL RELEASE Left 07/24/2013   Procedure: LEFT ARTHROSCOPY KNEE WITH LATERAL RELEASE,WITH DEBRIDEMENT/SHAVING (CHONDROPLASTY) ANTERIOR TIBIAL TUBERCLEPLASTY;  Surgeon: Loreta Aveaniel F Murphy, MD;  Location: [facility];  Service: Orthopedics;  Laterality: Left;    Family History   Problem Relation Age of Onset  . Thyroid disease Mother   . Diabetes Paternal Grandmother     Social History   Tobacco Use  . Smoking status: Never Smoker  . Smokeless tobacco: Never Used  Substance Use Topics  . Alcohol use: Yes    Comment: occasionally  . Drug use: No    Allergies:  Allergies  Allergen Reactions  . Topamax [Topiramate] Other (See Comments)    NUMBNESS OF HANDS AND FACE    Medications Prior to Admission  Medication Sig Dispense Refill Last Dose  . Fluconazole (DIFLUCAN PO) Take by mouth.   Taking  . levothyroxine (SYNTHROID, LEVOTHROID) 75 MCG tablet Take 75 mcg by mouth daily before breakfast.   Taking    Review of Systems  Constitutional: Negative.   Gastrointestinal: Positive for abdominal pain.  Genitourinary: Positive for vaginal discharge. Negative for dysuria and vaginal bleeding.   Physical Exam   Blood pressure 121/74, pulse 85, temperature (!) 97.5 F (36.4 C), resp. rate 18, height 5\' 7"  (1.702 m), weight 186 lb (84.4 kg).  Physical Exam  Nursing note and vitals reviewed. Constitutional: She is oriented to person, place, and time. She appears well-developed and well-nourished. No distress.  HENT:  Head: Normocephalic and atraumatic.  Eyes: Conjunctivae are normal. Right eye exhibits no discharge. Left eye exhibits no discharge. No scleral icterus.  Neck: Normal range of motion.  Cardiovascular: Normal rate, regular rhythm and normal heart sounds.  No murmur heard. Respiratory: Effort normal  and breath sounds normal. No respiratory distress. She has no wheezes.  Genitourinary: No bleeding in the vagina. Vaginal discharge found.  Genitourinary Comments: Moderate amount of white curd like discharge. No pooling of fluid.   Cervix 1/thick/posterior  Neurological: She is alert and oriented to person, place, and time.  Skin: Skin is warm and dry. She is not diaphoretic.  Psychiatric: She has a normal mood and affect. Her behavior is  normal. Judgment and thought content normal.    MAU Course  Procedures Results for orders placed or performed during the hospital encounter of 02/15/17 (from the past 24 hour(s))  Urinalysis, Routine w reflex microscopic     Status: Abnormal   Collection Time: 02/15/17  5:10 PM  Result Value Ref Range   Color, Urine YELLOW YELLOW   APPearance HAZY (A) CLEAR   Specific Gravity, Urine 1.019 1.005 - 1.030   pH 7.0 5.0 - 8.0   Glucose, UA NEGATIVE NEGATIVE mg/dL   Hgb urine dipstick NEGATIVE NEGATIVE   Bilirubin Urine NEGATIVE NEGATIVE   Ketones, ur NEGATIVE NEGATIVE mg/dL   Protein, ur NEGATIVE NEGATIVE mg/dL   Nitrite NEGATIVE NEGATIVE   Leukocytes, UA LARGE (A) NEGATIVE   RBC / HPF 0-5 0 - 5 RBC/hpf   WBC, UA 6-30 0 - 5 WBC/hpf   Bacteria, UA FEW (A) NONE SEEN   Squamous Epithelial / LPF 0-5 (A) NONE SEEN   Mucus PRESENT   Wet prep, genital     Status: Abnormal   Collection Time: 02/15/17  5:59 PM  Result Value Ref Range   Yeast Wet Prep HPF POC PRESENT (A) NONE SEEN   Trich, Wet Prep NONE SEEN NONE SEEN   Clue Cells Wet Prep HPF POC NONE SEEN NONE SEEN   WBC, Wet Prep HPF POC MANY (A) NONE SEEN   Sperm NONE SEEN   POCT fern test     Status: None   Collection Time: 02/15/17  6:18 PM  Result Value Ref Range   POCT Fern Test Negative = intact amniotic membranes    MDM NST:  Baseline: 125 bpm, Variability: Good {> 6 bpm), Accelerations: Reactive and Decelerations: Absent No pooling, fern negative. No contractions.  Wet prep + yeast C/w Dr. Waldo Laine to discharge home with rx terazol  Assessment and Plan  A: 1. Encounter for suspected PROM, with rupture of membranes not found   2. [redacted] weeks gestation of pregnancy   3. Vaginal yeast infection    P: Discharge home Rx terazol Discussed reasons to return to MAU Keep f/u with OB  Judeth Horn 02/15/2017, 5:41 PM

## 2017-02-15 NOTE — MAU Note (Signed)
Pt reports she has been having braxton hicks ctx on and off for about a week. Today the seem more intense . Pt also c/o some water discharge when she stands or when she has the contractions. Good fetal movement

## 2017-02-15 NOTE — MAU Note (Signed)
Pt states leaking of fluid and cramping in lower abd. Patient states constant pelvic pressure

## 2017-02-15 NOTE — Discharge Instructions (Signed)
Braxton Hicks Contractions °Contractions of the uterus can occur throughout pregnancy, but they are not always a sign that you are in labor. You may have practice contractions called Braxton Hicks contractions. These false labor contractions are sometimes confused with true labor. °What are Braxton Hicks contractions? °Braxton Hicks contractions are tightening movements that occur in the muscles of the uterus before labor. Unlike true labor contractions, these contractions do not result in opening (dilation) and thinning of the cervix. Toward the end of pregnancy (32-34 weeks), Braxton Hicks contractions can happen more often and may become stronger. These contractions are sometimes difficult to tell apart from true labor because they can be very uncomfortable. You should not feel embarrassed if you go to the hospital with false labor. °Sometimes, the only way to tell if you are in true labor is for your health care provider to look for changes in the cervix. The health care provider will do a physical exam and may monitor your contractions. If you are not in true labor, the exam should show that your cervix is not dilating and your water has not broken. °If there are other health problems associated with your pregnancy, it is completely safe for you to be sent home with false labor. You may continue to have Braxton Hicks contractions until you go into true labor. °How to tell the difference between true labor and false labor °True labor °· Contractions last 30-70 seconds. °· Contractions become very regular. °· Discomfort is usually felt in the top of the uterus, and it spreads to the lower abdomen and low back. °· Contractions do not go away with walking. °· Contractions usually become more intense and increase in frequency. °· The cervix dilates and gets thinner. °False labor °· Contractions are usually shorter and not as strong as true labor contractions. °· Contractions are usually irregular. °· Contractions  are often felt in the front of the lower abdomen and in the groin. °· Contractions may go away when you walk around or change positions while lying down. °· Contractions get weaker and are shorter-lasting as time goes on. °· The cervix usually does not dilate or become thin. °Follow these instructions at home: °· Take over-the-counter and prescription medicines only as told by your health care provider. °· Keep up with your usual exercises and follow other instructions from your health care provider. °· Eat and drink lightly if you think you are going into labor. °· If Braxton Hicks contractions are making you uncomfortable: °? Change your position from lying down or resting to walking, or change from walking to resting. °? Sit and rest in a tub of warm water. °? Drink enough fluid to keep your urine pale yellow. Dehydration may cause these contractions. °? Do slow and deep breathing several times an hour. °· Keep all follow-up prenatal visits as told by your health care provider. This is important. °Contact a health care provider if: °· You have a fever. °· You have continuous pain in your abdomen. °Get help right away if: °· Your contractions become stronger, more regular, and closer together. °· You have fluid leaking or gushing from your vagina. °· You pass blood-tinged mucus (bloody show). °· You have bleeding from your vagina. °· You have low back pain that you never had before. °· You feel your baby’s head pushing down and causing pelvic pressure. °· Your baby is not moving inside you as much as it used to. °Summary °· Contractions that occur before labor are called Braxton   Hicks contractions, false labor, or practice contractions. °· Braxton Hicks contractions are usually shorter, weaker, farther apart, and less regular than true labor contractions. True labor contractions usually become progressively stronger and regular and they become more frequent. °· Manage discomfort from Braxton Hicks contractions by  changing position, resting in a warm bath, drinking plenty of water, or practicing deep breathing. °This information is not intended to replace advice given to you by your health care provider. Make sure you discuss any questions you have with your health care provider. °Document Released: 05/11/2016 Document Revised: 05/11/2016 Document Reviewed: 05/11/2016 °Elsevier Interactive Patient Education © 2018 [employer] ° °

## 2017-02-19 DIAGNOSIS — O99283 Endocrine, nutritional and metabolic diseases complicating pregnancy, third trimester: Secondary | ICD-10-CM | POA: Diagnosis not present

## 2017-02-19 DIAGNOSIS — Z3A35 35 weeks gestation of pregnancy: Secondary | ICD-10-CM | POA: Diagnosis not present

## 2017-02-19 DIAGNOSIS — Z3685 Encounter for antenatal screening for Streptococcus B: Secondary | ICD-10-CM | POA: Diagnosis not present

## 2017-02-19 LAB — OB RESULTS CONSOLE GBS: GBS: NEGATIVE

## 2017-02-20 ENCOUNTER — Ambulatory Visit (INDEPENDENT_AMBULATORY_CARE_PROVIDER_SITE_OTHER): Payer: BLUE CROSS/BLUE SHIELD | Admitting: Family Medicine

## 2017-02-20 ENCOUNTER — Other Ambulatory Visit: Payer: Self-pay

## 2017-02-20 ENCOUNTER — Encounter: Payer: Self-pay | Admitting: Family Medicine

## 2017-02-20 VITALS — BP 122/72 | HR 87 | Temp 98.2°F | Resp 17 | Ht 66.5 in | Wt 186.0 lb

## 2017-02-20 DIAGNOSIS — J3489 Other specified disorders of nose and nasal sinuses: Secondary | ICD-10-CM | POA: Diagnosis not present

## 2017-02-20 DIAGNOSIS — O0001 Abdominal pregnancy with intrauterine pregnancy: Secondary | ICD-10-CM

## 2017-02-20 DIAGNOSIS — J029 Acute pharyngitis, unspecified: Secondary | ICD-10-CM

## 2017-02-20 DIAGNOSIS — R059 Cough, unspecified: Secondary | ICD-10-CM

## 2017-02-20 DIAGNOSIS — R05 Cough: Secondary | ICD-10-CM | POA: Diagnosis not present

## 2017-02-20 DIAGNOSIS — J22 Unspecified acute lower respiratory infection: Secondary | ICD-10-CM | POA: Diagnosis not present

## 2017-02-20 LAB — POCT RAPID STREP A (OFFICE): RAPID STREP A SCREEN: NEGATIVE

## 2017-02-20 MED ORDER — AZITHROMYCIN 250 MG PO TABS: ORAL_TABLET | ORAL | 0 refills | Status: DC

## 2017-02-20 NOTE — Progress Notes (Signed)
Subjective:  This chart was scribed for Shade Flood, MD by Veverly Fells, at Primary Care at [facility].  This patient was seen in room 2 and the patient's care was started at 9:28 AM.   Chief Complaint  Patient presents with  . Cough    x2 weeks, pt states she is coughing up green mucous.  . Sore Throat    Pt states she had a strep throat test done at her OB about 2 weeks ago and it was neg. Pt states it hurts to swallow. Pt states she noticed some pus pockets in the back of her throat.     Patient ID: Jackie [name], female    DOB: 11-28-1990, 27 y.o.   MRN: [id number]  HPI HPI Comments: Jackie [name] is a 27 y.o. female who presents to Primary Care at [facility] complaining of a sore throat and cough onset two weeks ago. Productive cough with discolored mucous started two days ago. She has associated symptoms of clear nasal discharge and did note of "pus like pockets" at the back of her throat a couple of days ago.  Patient was not able to sleep last night as her sore throat was bothering her more than her other symptoms.  Denies fever/chills, shortness of breath, difficulty breathing.   Patient is currently pregnant (36 weeks tomorrow- boy, first child).  Reportedly had negative strep test at [facility] two weeks ago.  [facility] and possible leaking of fluids along with yeast infection.  NST was normal, fern negative. She was [redacted] weeks gestation at that time.  Yeast vaginitis treated with Terazol. --- Patients yeast infection has improved.  She is still having contractions intermittently and was told by her OB yesterday that they were true contractions.   Patient Active Problem List   Diagnosis Date Noted  . Thyroiditis 08/26/2016  . History of neck pain 10/22/2015  . Patellofemoral syndrome 10/30/2013  . Patella-femoral syndrome 07/24/2013  . Migraine 11/14/2012  . ABDOMINAL PAIN-RUQ 12/23/2009   Past Medical History:    Diagnosis Date  . Chondromalacia of right patella 10/2013  . Compartment syndrome, nontraumatic, lower extremity 10/2013   right  . Migraines    Past Surgical History:  Procedure Laterality Date  . FASCIOTOMY Right 10/30/2013   Procedure: RELEASE RIGHT ANTERIOR COMPARTMENT FASCIOTOMY ;  Surgeon: Loreta Ave, MD;  Location: [facility];  Service: Orthopedics;  Laterality: Right;  . KNEE ARTHROSCOPY W/ PLICA EXCISION Left 07/12/2010  . KNEE ARTHROSCOPY WITH FULKERSON SLIDE Right 10/30/2013   Procedure: RIGHT KNEE SCOPE LATERAL RELEASE/CHONDROPLASTY ANTERIOR TIBIAL TUBERCLEPLASTY;  Surgeon: Loreta Ave, MD;  Location: [facility];  Service: Orthopedics;  Laterality: Right;  ANESTHESIA:  GENERAL, PRE/POST OP FEMORAL NERVE  . KNEE ARTHROSCOPY WITH LATERAL RELEASE Left 07/24/2013   Procedure: LEFT ARTHROSCOPY KNEE WITH LATERAL RELEASE,WITH DEBRIDEMENT/SHAVING (CHONDROPLASTY) ANTERIOR TIBIAL TUBERCLEPLASTY;  Surgeon: Loreta Ave, MD;  Location: [facility];  Service: Orthopedics;  Laterality: Left;   Allergies  Allergen Reactions  . Topamax [Topiramate] Other (See Comments)    NUMBNESS OF HANDS AND FACE   Prior to Admission medications   Medication Sig Start Date End Date Taking? Authorizing Provider  Fluconazole (DIFLUCAN PO) Take by mouth.   Yes [provider]  levothyroxine (SYNTHROID, LEVOTHROID) 75 MCG tablet Take 75 mcg by mouth daily before breakfast.   Yes [provider]  terconazole (TERAZOL 3) 0.8 %  vaginal cream Place 1 applicator vaginally at bedtime. 02/15/17  Yes Judeth Horn, NP   Social History   Socioeconomic History  . Marital status: Married    Spouse name: Not on file  . Number of children: 0  . Years of education: 5  . Highest education level: Not on file  Social Needs  . Financial resource strain: Not on file  . Food insecurity - worry: Not on file  . Food insecurity - inability: Not on file   . Transportation needs - medical: Not on file  . Transportation needs - non-medical: Not on file  Occupational History  . Occupation: MINOR    Employer: STUDENT  Tobacco Use  . Smoking status: Never Smoker  . Smokeless tobacco: Never Used  Substance and Sexual Activity  . Alcohol use: Yes    Comment: occasionally  . Drug use: No  . Sexual activity: Not on file  Other Topics Concern  . Not on file  Social History Narrative   Patient is single and lives with a roommate.   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   Patient has a college education.   Patient is right-handed.   Patient drinks very little caffeine.     Review of Systems  Constitutional: Negative for chills and fever.  HENT: Positive for congestion, rhinorrhea and sore throat.   Eyes: Negative for pain, redness and itching.  Respiratory: Positive for cough. Negative for shortness of breath.   Gastrointestinal: Negative for nausea and vomiting.  Neurological: Negative for speech difficulty.       Objective:   Physical Exam  Constitutional: She is oriented to person, place, and time. She appears well-developed and well-nourished. No distress.  HENT:  Head: Normocephalic and atraumatic.  Neck:  Slightly tender right AC node.   Pulmonary/Chest: Effort normal. No respiratory distress.  Neurological: She is alert and oriented to person, place, and time.  Skin: Skin is warm and dry.  Psychiatric: She has a normal mood and affect. Her behavior is normal.    Vitals:   02/20/17 0841  BP: 122/72  Pulse: 87  Resp: 17  Temp: 98.2 F (36.8 C)  TempSrc: Oral  SpO2: 98%  Weight: 186 lb (84.4 kg)  Height: 5' 6.5" (1.689 m)       Assessment & Plan:    Jackie [name] is a 27 y.o. female Sore throat - Plan: POCT rapid strep A, azithromycin (ZITHROMAX) 250 MG tablet  LRTI (lower respiratory tract infection) - Plan: azithromycin (ZITHROMAX) 250 MG tablet  Cough - Plan: azithromycin (ZITHROMAX) 250 MG tablet  Sinus  pressure - Plan: azithromycin (ZITHROMAX) 250 MG tablet  Abdominal pregnancy with intrauterine pregnancy  Currently [redacted] weeks pregnant. 2 week history of cough, sore throat, with recent worsening and now discolored mucus production. Slight sinus tenderness. Rapid strep negative, less likely strep pharyngitis, but does have small enlarged AC node on right.  - Start azithromycin, Z-Pak. Potential side effects discussed, and can clear with OB if needed. Other cough/cold medications at description of obstetrician. Symptomatic care discussed, RTC precautions given   Meds ordered this encounter  Medications  . azithromycin (ZITHROMAX) 250 MG tablet    Sig: Take 2 pills by mouth on day 1, then 1 pill by mouth per day on days 2 through 5.    Dispense:  6 tablet    Refill:  0   Patient Instructions    Start azithromycin for cough and sore throat. That also can also help if early  sinus infection. Okay to call and verify this medicine is okay to take with your pregnancy. Other cough and cold medicines as recommended by your obstetrician. Make sure you're drinking plenty fluids and rest as needed. Thank you for coming in today.   Return to the clinic or go to the nearest emergency room if any of your symptoms worsen or new symptoms occur.   Sore Throat A sore throat is pain, burning, irritation, or scratchiness in the throat. When you have a sore throat, you may feel pain or tenderness in your throat when you swallow or talk. Many things can cause a sore throat, including:  An infection.  Seasonal allergies.  Dryness in the air.  Irritants, such as smoke or pollution.  Gastroesophageal reflux disease (GERD).  A tumor.  A sore throat is often the first sign of another sickness. It may happen with other symptoms, such as coughing, sneezing, fever, and swollen neck glands. Most sore throats go away without medical treatment. Follow these instructions at home:  Take over-the-counter  medicines only as told by your health care provider.  Drink enough fluids to keep your urine clear or pale yellow.  Rest as needed.  To help with pain, try: ? Sipping warm liquids, such as broth, herbal tea, or warm water. ? Eating or drinking cold or frozen liquids, such as frozen ice pops. ? Gargling with a salt-water mixture 3-4 times a day or as needed. To make a salt-water mixture, completely dissolve -1 tsp of salt in 1 cup of warm water. ? Sucking on hard candy or throat lozenges. ? Putting a cool-mist humidifier in your bedroom at night to moisten the air. ? Sitting in the bathroom with the door closed for 5-10 minutes while you run hot water in the shower.  Do not use any tobacco products, such as cigarettes, chewing tobacco, and e-cigarettes. If you need help quitting, ask your health care provider. Contact a health care provider if:  You have a fever for more than 2-3 days.  You have symptoms that last (are persistent) for more than 2-3 days.  Your throat does not get better within 7 days.  You have a fever and your symptoms suddenly get worse. Get help right away if:  You have difficulty breathing.  You cannot swallow fluids, soft foods, or your saliva.  You have increased swelling in your throat or neck.  You have persistent nausea and vomiting. This information is not intended to replace advice given to you by your health care provider. Make sure you discuss any questions you have with your health care provider. Document Released: 02/03/2004 Document Revised: 08/22/2015 Document Reviewed: 10/16/2014 Elsevier Interactive Patient Education  2018 Elsevier Inc.  Cough, Adult Coughing is a reflex that clears your throat and your airways. Coughing helps to heal and protect your lungs. It is normal to cough occasionally, but a cough that happens with other symptoms or lasts a long time may be a sign of a condition that needs treatment. A cough may last only 2-3 weeks  (acute), or it may last longer than 8 weeks (chronic). What are the causes? Coughing is commonly caused by:  Breathing in substances that irritate your lungs.  A viral or bacterial respiratory infection.  Allergies.  Asthma.  Postnasal drip.  Smoking.  Acid backing up from the stomach into the esophagus (gastroesophageal reflux).  Certain medicines.  Chronic lung problems, including COPD (or rarely, lung cancer).  Other medical conditions such as heart failure.  Follow these instructions at home: Pay attention to any changes in your symptoms. Take these actions to help with your discomfort:  Take medicines only as told by your health care provider. ? If you were prescribed an antibiotic medicine, take it as told by your health care provider. Do not stop taking the antibiotic even if you start to feel better. ? Talk with your health care provider before you take a cough suppressant medicine.  Drink enough fluid to keep your urine clear or pale yellow.  If the air is dry, use a cold steam vaporizer or humidifier in your bedroom or your home to help loosen secretions.  Avoid anything that causes you to cough at work or at home.  If your cough is worse at night, try sleeping in a semi-upright position.  Avoid cigarette smoke. If you smoke, quit smoking. If you need help quitting, ask your health care provider.  Avoid caffeine.  Avoid alcohol.  Rest as needed.  Contact a health care provider if:  You have new symptoms.  You cough up pus.  Your cough does not get better after 2-3 weeks, or your cough gets worse.  You cannot control your cough with suppressant medicines and you are losing sleep.  You develop pain that is getting worse or pain that is not controlled with pain medicines.  You have a fever.  You have unexplained weight loss.  You have night sweats. Get help right away if:  You cough up blood.  You have difficulty breathing.  Your heartbeat  is very fast. This information is not intended to replace advice given to you by your health care provider. Make sure you discuss any questions you have with your health care provider. Document Released: 06/24/2010 Document Revised: 06/03/2015 Document Reviewed: 03/04/2014 Elsevier Interactive Patient Education  2018 [employer]   IF you received an x-ray today, you will receive an invoice from [facility]. Please contact [facility] at [phone] with questions or concerns regarding your invoice.   IF you received labwork today, you will receive an invoice from [facility]. Please contact [facility] at [phone] with questions or concerns regarding your invoice.   Our billing staff will not be able to assist you with questions regarding bills from these companies.  You will be contacted with the lab results as soon as they are available. The fastest way to get your results is to activate your My Chart account. Instructions are located on the last page of this paperwork. If you have not heard from us regarding the results in 2 weeks, please contact this office.       I personally performed the services described in this documentation, which was scribed in my presence. The recorded information has been reviewed and considered for accuracy and completeness, addended by me as needed, and agree with information above.  Signed,   Meredith StaggersJeffrey Kytzia Gienger, MD Primary Care at [facility].  02/20/17 9:43 AM

## 2017-02-20 NOTE — Patient Instructions (Addendum)
Start azithromycin for cough and sore throat. That also can also help if early sinus infection. Okay to call and verify this medicine is okay to take with your pregnancy. Other cough and cold medicines as recommended by your obstetrician. Make sure you're drinking plenty fluids and rest as needed. Thank you for coming in today.   Return to the clinic or go to the nearest emergency room if any of your symptoms worsen or new symptoms occur.   Sore Throat A sore throat is pain, burning, irritation, or scratchiness in the throat. When you have a sore throat, you may feel pain or tenderness in your throat when you swallow or talk. Many things can cause a sore throat, including:  An infection.  Seasonal allergies.  Dryness in the air.  Irritants, such as smoke or pollution.  Gastroesophageal reflux disease (GERD).  A tumor.  A sore throat is often the first sign of another sickness. It may happen with other symptoms, such as coughing, sneezing, fever, and swollen neck glands. Most sore throats go away without medical treatment. Follow these instructions at home:  Take over-the-counter medicines only as told by your health care provider.  Drink enough fluids to keep your urine clear or pale yellow.  Rest as needed.  To help with pain, try: ? Sipping warm liquids, such as broth, herbal tea, or warm water. ? Eating or drinking cold or frozen liquids, such as frozen ice pops. ? Gargling with a salt-water mixture 3-4 times a day or as needed. To make a salt-water mixture, completely dissolve -1 tsp of salt in 1 cup of warm water. ? Sucking on hard candy or throat lozenges. ? Putting a cool-mist humidifier in your bedroom at night to moisten the air. ? Sitting in the bathroom with the door closed for 5-10 minutes while you run hot water in the shower.  Do not use any tobacco products, such as cigarettes, chewing tobacco, and e-cigarettes. If you need help quitting, ask your health care  provider. Contact a health care provider if:  You have a fever for more than 2-3 days.  You have symptoms that last (are persistent) for more than 2-3 days.  Your throat does not get better within 7 days.  You have a fever and your symptoms suddenly get worse. Get help right away if:  You have difficulty breathing.  You cannot swallow fluids, soft foods, or your saliva.  You have increased swelling in your throat or neck.  You have persistent nausea and vomiting. This information is not intended to replace advice given to you by your health care provider. Make sure you discuss any questions you have with your health care provider. Document Released: 02/03/2004 Document Revised: 08/22/2015 Document Reviewed: 10/16/2014 Elsevier Interactive Patient Education  2018 Elsevier Inc.  Cough, Adult Coughing is a reflex that clears your throat and your airways. Coughing helps to heal and protect your lungs. It is normal to cough occasionally, but a cough that happens with other symptoms or lasts a long time may be a sign of a condition that needs treatment. A cough may last only 2-3 weeks (acute), or it may last longer than 8 weeks (chronic). What are the causes? Coughing is commonly caused by:  Breathing in substances that irritate your lungs.  A viral or bacterial respiratory infection.  Allergies.  Asthma.  Postnasal drip.  Smoking.  Acid backing up from the stomach into the esophagus (gastroesophageal reflux).  Certain medicines.  Chronic lung problems, including COPD (  or rarely, lung cancer).  Other medical conditions such as heart failure.  Follow these instructions at home: Pay attention to any changes in your symptoms. Take these actions to help with your discomfort:  Take medicines only as told by your health care provider. ? If you were prescribed an antibiotic medicine, take it as told by your health care provider. Do not stop taking the antibiotic even if you  start to feel better. ? Talk with your health care provider before you take a cough suppressant medicine.  Drink enough fluid to keep your urine clear or pale yellow.  If the air is dry, use a cold steam vaporizer or humidifier in your bedroom or your home to help loosen secretions.  Avoid anything that causes you to cough at work or at home.  If your cough is worse at night, try sleeping in a semi-upright position.  Avoid cigarette smoke. If you smoke, quit smoking. If you need help quitting, ask your health care provider.  Avoid caffeine.  Avoid alcohol.  Rest as needed.  Contact a health care provider if:  You have new symptoms.  You cough up pus.  Your cough does not get better after 2-3 weeks, or your cough gets worse.  You cannot control your cough with suppressant medicines and you are losing sleep.  You develop pain that is getting worse or pain that is not controlled with pain medicines.  You have a fever.  You have unexplained weight loss.  You have night sweats. Get help right away if:  You cough up blood.  You have difficulty breathing.  Your heartbeat is very fast. This information is not intended to replace advice given to you by your health care provider. Make sure you discuss any questions you have with your health care provider. Document Released: 06/24/2010 Document Revised: 06/03/2015 Document Reviewed: 03/04/2014 Elsevier Interactive Patient Education  2018 [employer].   IF you received an x-ray today, you will receive an invoice from [facility]. Please contact [facility] at [phone] with questions or concerns regarding your invoice.   IF you received labwork today, you will receive an invoice from [facility]. Please contact [facility] at [phone] with questions or concerns regarding your invoice.   Our billing staff will not be able to assist you with questions regarding bills from these companies.  You will be  contacted with the lab results as soon as they are available. The fastest way to get your results is to activate your My Chart account. Instructions are located on the last page of this paperwork. If you have not heard from [place] regarding the results in 2 weeks, please contact this office.

## 2017-02-21 DIAGNOSIS — Z3A36 36 weeks gestation of pregnancy: Secondary | ICD-10-CM | POA: Diagnosis not present

## 2017-02-21 DIAGNOSIS — N898 Other specified noninflammatory disorders of vagina: Secondary | ICD-10-CM | POA: Diagnosis not present

## 2017-02-21 DIAGNOSIS — O99283 Endocrine, nutritional and metabolic diseases complicating pregnancy, third trimester: Secondary | ICD-10-CM | POA: Diagnosis not present

## 2017-02-27 DIAGNOSIS — O99283 Endocrine, nutritional and metabolic diseases complicating pregnancy, third trimester: Secondary | ICD-10-CM | POA: Diagnosis not present

## 2017-02-27 DIAGNOSIS — Z3A36 36 weeks gestation of pregnancy: Secondary | ICD-10-CM | POA: Diagnosis not present

## 2017-03-05 ENCOUNTER — Telehealth (HOSPITAL_COMMUNITY): Payer: Self-pay | Admitting: *Deleted

## 2017-03-05 ENCOUNTER — Other Ambulatory Visit: Payer: Self-pay | Admitting: Obstetrics

## 2017-03-05 ENCOUNTER — Encounter (HOSPITAL_COMMUNITY): Payer: Self-pay | Admitting: *Deleted

## 2017-03-05 DIAGNOSIS — Z3A37 37 weeks gestation of pregnancy: Secondary | ICD-10-CM | POA: Diagnosis not present

## 2017-03-05 DIAGNOSIS — O99283 Endocrine, nutritional and metabolic diseases complicating pregnancy, third trimester: Secondary | ICD-10-CM | POA: Diagnosis not present

## 2017-03-05 NOTE — Telephone Encounter (Signed)
Preadmission screen  

## 2017-03-08 ENCOUNTER — Other Ambulatory Visit: Payer: Self-pay | Admitting: Obstetrics

## 2017-03-08 DIAGNOSIS — B373 Candidiasis of vulva and vagina: Secondary | ICD-10-CM | POA: Diagnosis not present

## 2017-03-08 DIAGNOSIS — O26893 Other specified pregnancy related conditions, third trimester: Secondary | ICD-10-CM | POA: Diagnosis not present

## 2017-03-08 DIAGNOSIS — Z3A38 38 weeks gestation of pregnancy: Secondary | ICD-10-CM | POA: Diagnosis not present

## 2017-03-13 NOTE — H&P (Signed)
Jackie [name] is a 27 y.o. G1P0 at 23w 0d  presenting for IOL. Pt notes rare contractions. Good fetal movement, No vaginal bleeding, not leaking fluid.  [facility] at [facility] 10 wks - Dated by 8 wk u/s c/w LMP - hypothyroidism, on synthroid - GERD, on meds - low wt gain. AGA fetus - R-NI, needs MMR PP - Anemia, on med   Prenatal Transfer Tool  Maternal Diabetes: No Genetic Screening: Normal Maternal Ultrasounds/Referrals: Normal Fetal Ultrasounds or other Referrals:  None Maternal Substance Abuse:  No Significant Maternal Medications:  None Significant Maternal Lab Results: None     OB History    Gravida Para Term Preterm AB Living   1             SAB TAB Ectopic Multiple Live Births                 Past Medical History:  Diagnosis Date  . Chondromalacia of right patella 10/2013  . Compartment syndrome, nontraumatic, lower extremity 10/2013   right  . Hypothyroidism   . Migraines    Past Surgical History:  Procedure Laterality Date  . FASCIOTOMY Right 10/30/2013   Procedure: RELEASE RIGHT ANTERIOR COMPARTMENT FASCIOTOMY ;  Surgeon: Ninetta Lights, MD;  Location: [place];  Service: Orthopedics;  Laterality: Right;  . KNEE ARTHROSCOPY W/ PLICA EXCISION Left 1/0/2585  . KNEE ARTHROSCOPY WITH FULKERSON SLIDE Right 10/30/2013   Procedure: RIGHT KNEE SCOPE LATERAL RELEASE/CHONDROPLASTY ANTERIOR TIBIAL TUBERCLEPLASTY;  Surgeon: Ninetta Lights, MD;  Location: [place];  Service: Orthopedics;  Laterality: Right;  ANESTHESIA:  GENERAL, PRE/POST OP FEMORAL NERVE  . KNEE ARTHROSCOPY WITH LATERAL RELEASE Left 07/24/2013   Procedure: LEFT ARTHROSCOPY KNEE WITH LATERAL RELEASE,WITH DEBRIDEMENT/SHAVING (CHONDROPLASTY) ANTERIOR TIBIAL TUBERCLEPLASTY;  Surgeon: Ninetta Lights, MD;  Location: [place];  Service: Orthopedics;  Laterality: Left;   Family History: family history includes Diabetes in her paternal grandmother;  Migraines in her father; Thyroid disease in her mother. Social History:  reports that  has never smoked. she has never used smokeless tobacco. She reports that she drinks alcohol. She reports that she does not use drugs.  Review of Systems - Negative except discomfort of pregnancy     Prenatal labs: ABO, Rh: O/Positive/-- (08/27 0000) Antibody: Negative (08/27 0000) Rubella: Nonimmune (08/27 0000) RPR: Nonreactive (08/27 0000)  HBsAg: Negative (08/27 0000)  HIV: Non-reactive (08/27 0000)  GBS: Negative (02/11 0000)  1 hr Glucola 99  Genetic screening nl NT, nl AFP Anatomy US normal   Assessment/Plan: 27 y.o. G1P0 at [redacted]w[redacted]d- Elective IOL at term. Plan pitocin and AROM. R/B d/w pt - GBS neg - MMR PP.  - hypothyroidism - anemia    KAla Dach3/05/2017, 9:42 PM

## 2017-03-14 ENCOUNTER — Inpatient Hospital Stay (HOSPITAL_COMMUNITY)
Admission: AD | Admit: 2017-03-14 | Discharge: 2017-03-15 | DRG: 807 | Disposition: A | Discharge status: Home / Self Care | Payer: BLUE CROSS/BLUE SHIELD | Source: Ambulatory Visit | Attending: Obstetrics and Gynecology | Admitting: Obstetrics and Gynecology

## 2017-03-14 ENCOUNTER — Encounter (HOSPITAL_COMMUNITY): Payer: Self-pay

## 2017-03-14 ENCOUNTER — Inpatient Hospital Stay (HOSPITAL_COMMUNITY)
Admission: RE | Admit: 2017-03-14 | Discharge: 2017-03-14 | Disposition: A | Discharge status: Home / Self Care | Payer: BLUE CROSS/BLUE SHIELD | Source: Ambulatory Visit | Attending: Obstetrics | Admitting: Obstetrics

## 2017-03-14 ENCOUNTER — Inpatient Hospital Stay (HOSPITAL_COMMUNITY): Payer: BLUE CROSS/BLUE SHIELD | Admitting: Anesthesiology

## 2017-03-14 DIAGNOSIS — K219 Gastro-esophageal reflux disease without esophagitis: Secondary | ICD-10-CM | POA: Diagnosis not present

## 2017-03-14 DIAGNOSIS — O99284 Endocrine, nutritional and metabolic diseases complicating childbirth: Principal | ICD-10-CM | POA: Diagnosis present

## 2017-03-14 DIAGNOSIS — E039 Hypothyroidism, unspecified: Secondary | ICD-10-CM | POA: Diagnosis present

## 2017-03-14 DIAGNOSIS — Z23 Encounter for immunization: Secondary | ICD-10-CM | POA: Diagnosis not present

## 2017-03-14 DIAGNOSIS — Z3490 Encounter for supervision of normal pregnancy, unspecified, unspecified trimester: Secondary | ICD-10-CM | POA: Diagnosis present

## 2017-03-14 DIAGNOSIS — O9962 Diseases of the digestive system complicating childbirth: Secondary | ICD-10-CM | POA: Diagnosis present

## 2017-03-14 DIAGNOSIS — Z3483 Encounter for supervision of other normal pregnancy, third trimester: Secondary | ICD-10-CM | POA: Diagnosis not present

## 2017-03-14 DIAGNOSIS — D649 Anemia, unspecified: Secondary | ICD-10-CM | POA: Diagnosis present

## 2017-03-14 DIAGNOSIS — O9902 Anemia complicating childbirth: Secondary | ICD-10-CM | POA: Diagnosis present

## 2017-03-14 DIAGNOSIS — Z3A39 39 weeks gestation of pregnancy: Secondary | ICD-10-CM

## 2017-03-14 DIAGNOSIS — Z412 Encounter for routine and ritual male circumcision: Secondary | ICD-10-CM | POA: Diagnosis not present

## 2017-03-14 LAB — CBC
HEMATOCRIT: 38.7 % (ref 36.0–46.0)
HEMOGLOBIN: 13.7 g/dL (ref 12.0–15.0)
MCH: 31.3 pg (ref 26.0–34.0)
MCHC: 35.4 g/dL (ref 30.0–36.0)
MCV: 88.4 fL (ref 78.0–100.0)
Platelets: 177 10*3/uL (ref 150–400)
RBC: 4.38 MIL/uL (ref 3.87–5.11)
RDW: 13.4 % (ref 11.5–15.5)
WBC: 9.4 10*3/uL (ref 4.0–10.5)

## 2017-03-14 LAB — TYPE AND SCREEN
ABO/RH(D): O POS
Antibody Screen: NEGATIVE

## 2017-03-14 LAB — RPR: RPR: NONREACTIVE

## 2017-03-14 LAB — ABO/RH: ABO/RH(D): O POS

## 2017-03-14 MED ORDER — LIDOCAINE HCL (PF) 1 % IJ SOLN
INTRAMUSCULAR | Status: AC
  Filled 2017-03-14: qty 30

## 2017-03-14 MED ORDER — PHENYLEPHRINE 40 MCG/ML (10ML) SYRINGE FOR IV PUSH (FOR BLOOD PRESSURE SUPPORT)
80.0000 ug | PREFILLED_SYRINGE | INTRAVENOUS | Status: DC | PRN
  Filled 2017-03-14: qty 10

## 2017-03-14 MED ORDER — PRENATAL MULTIVITAMIN CH
1.0000 | ORAL_TABLET | Freq: Every day | ORAL | Status: DC
  Administered 2017-03-14 – 2017-03-15 (×2): 1 via ORAL
  Filled 2017-03-14 (×2): qty 1

## 2017-03-14 MED ORDER — LACTATED RINGERS IV SOLN
INTRAVENOUS | Status: DC
  Administered 2017-03-14 (×2): via INTRAVENOUS

## 2017-03-14 MED ORDER — DIPHENHYDRAMINE HCL 25 MG PO CAPS: 25.0000 mg | ORAL_CAPSULE | Freq: Four times a day (QID) | ORAL | Status: DC | PRN

## 2017-03-14 MED ORDER — MEASLES, MUMPS & RUBELLA VAC ~~LOC~~ INJ
0.5000 mL | INJECTION | Freq: Once | SUBCUTANEOUS | Status: AC
  Administered 2017-03-15: 0.5 mL via SUBCUTANEOUS
  Filled 2017-03-14: qty 0.5

## 2017-03-14 MED ORDER — ACETAMINOPHEN 325 MG PO TABS: 650.0000 mg | ORAL_TABLET | ORAL | Status: DC | PRN

## 2017-03-14 MED ORDER — FENTANYL 2.5 MCG/ML BUPIVACAINE 1/10 % EPIDURAL INFUSION (WH - ANES)
14.0000 mL/h | INTRAMUSCULAR | Status: DC | PRN
  Administered 2017-03-14: 14 mL/h via EPIDURAL
  Filled 2017-03-14: qty 100

## 2017-03-14 MED ORDER — BUTORPHANOL TARTRATE 1 MG/ML IJ SOLN
INTRAMUSCULAR | Status: AC
  Filled 2017-03-14: qty 1

## 2017-03-14 MED ORDER — LACTATED RINGERS IV SOLN: 500.0000 mL | INTRAVENOUS | Status: DC | PRN

## 2017-03-14 MED ORDER — ONDANSETRON HCL 4 MG PO TABS: 4.0000 mg | ORAL_TABLET | ORAL | Status: DC | PRN

## 2017-03-14 MED ORDER — OXYCODONE HCL 5 MG PO TABS: 5.0000 mg | ORAL_TABLET | ORAL | Status: DC | PRN

## 2017-03-14 MED ORDER — SIMETHICONE 80 MG PO CHEW: 80.0000 mg | CHEWABLE_TABLET | ORAL | Status: DC | PRN

## 2017-03-14 MED ORDER — TERBUTALINE SULFATE 1 MG/ML IJ SOLN: 0.2500 mg | Freq: Once | INTRAMUSCULAR | Status: DC | PRN

## 2017-03-14 MED ORDER — LEVOTHYROXINE SODIUM 75 MCG PO TABS
75.0000 ug | ORAL_TABLET | Freq: Every day | ORAL | Status: DC
  Administered 2017-03-14: 75 ug via ORAL
  Filled 2017-03-14: qty 1

## 2017-03-14 MED ORDER — BENZOCAINE-MENTHOL 20-0.5 % EX AERO: 1.0000 "application " | INHALATION_SPRAY | CUTANEOUS | Status: DC | PRN

## 2017-03-14 MED ORDER — SENNOSIDES-DOCUSATE SODIUM 8.6-50 MG PO TABS
2.0000 | ORAL_TABLET | ORAL | Status: DC
  Administered 2017-03-14: 2 via ORAL
  Filled 2017-03-14: qty 2

## 2017-03-14 MED ORDER — DIBUCAINE 1 % RE OINT: 1.0000 "application " | TOPICAL_OINTMENT | RECTAL | Status: DC | PRN

## 2017-03-14 MED ORDER — IBUPROFEN 600 MG PO TABS
600.0000 mg | ORAL_TABLET | Freq: Four times a day (QID) | ORAL | Status: DC
  Administered 2017-03-14 – 2017-03-15 (×4): 600 mg via ORAL
  Filled 2017-03-14 (×4): qty 1

## 2017-03-14 MED ORDER — PHENYLEPHRINE 40 MCG/ML (10ML) SYRINGE FOR IV PUSH (FOR BLOOD PRESSURE SUPPORT): 80.0000 ug | PREFILLED_SYRINGE | INTRAVENOUS | Status: DC | PRN

## 2017-03-14 MED ORDER — ONDANSETRON HCL 4 MG/2ML IJ SOLN: 4.0000 mg | INTRAMUSCULAR | Status: DC | PRN

## 2017-03-14 MED ORDER — OXYTOCIN 40 UNITS IN LACTATED RINGERS INFUSION - SIMPLE MED: 666.0000 m[IU]/h | Freq: Once | INTRAVENOUS | Status: DC

## 2017-03-14 MED ORDER — BUTORPHANOL TARTRATE 1 MG/ML IJ SOLN
1.0000 mg | Freq: Once | INTRAMUSCULAR | Status: AC
Start: 2017-03-14 — End: 2017-03-14
  Administered 2017-03-14: 1 mg via INTRAVENOUS

## 2017-03-14 MED ORDER — MISOPROSTOL 25 MCG QUARTER TABLET: 25.0000 ug | ORAL_TABLET | ORAL | Status: DC | PRN

## 2017-03-14 MED ORDER — SOD CITRATE-CITRIC ACID 500-334 MG/5ML PO SOLN: 30.0000 mL | ORAL | Status: DC | PRN

## 2017-03-14 MED ORDER — COCONUT OIL OIL: 1.0000 "application " | TOPICAL_OIL | Status: DC | PRN

## 2017-03-14 MED ORDER — EPHEDRINE 5 MG/ML INJ: 10.0000 mg | INTRAVENOUS | Status: DC | PRN

## 2017-03-14 MED ORDER — ZOLPIDEM TARTRATE 5 MG PO TABS: 5.0000 mg | ORAL_TABLET | Freq: Every evening | ORAL | Status: DC | PRN

## 2017-03-14 MED ORDER — OXYTOCIN 40 UNITS IN LACTATED RINGERS INFUSION - SIMPLE MED: 2.5000 [IU]/h | INTRAVENOUS | Status: DC

## 2017-03-14 MED ORDER — OXYTOCIN 40 UNITS IN LACTATED RINGERS INFUSION - SIMPLE MED
1.0000 m[IU]/min | INTRAVENOUS | Status: DC
  Administered 2017-03-14: 2 m[IU]/min via INTRAVENOUS
  Filled 2017-03-14: qty 1000

## 2017-03-14 MED ORDER — DIPHENHYDRAMINE HCL 50 MG/ML IJ SOLN: 12.5000 mg | INTRAMUSCULAR | Status: DC | PRN

## 2017-03-14 MED ORDER — OXYCODONE HCL 5 MG PO TABS: 10.0000 mg | ORAL_TABLET | ORAL | Status: DC | PRN

## 2017-03-14 MED ORDER — LACTATED RINGERS IV SOLN
500.0000 mL | Freq: Once | INTRAVENOUS | Status: AC
  Administered 2017-03-14: 500 mL via INTRAVENOUS

## 2017-03-14 MED ORDER — ONDANSETRON HCL 4 MG/2ML IJ SOLN: 4.0000 mg | Freq: Four times a day (QID) | INTRAMUSCULAR | Status: DC | PRN

## 2017-03-14 MED ORDER — WITCH HAZEL-GLYCERIN EX PADS: 1.0000 "application " | MEDICATED_PAD | CUTANEOUS | Status: DC | PRN

## 2017-03-14 MED ORDER — LIDOCAINE HCL (PF) 1 % IJ SOLN
INTRAMUSCULAR | Status: DC | PRN
  Administered 2017-03-14: 5 mL via EPIDURAL
  Administered 2017-03-14: 6 mL via EPIDURAL

## 2017-03-14 MED ORDER — TETANUS-DIPHTH-ACELL PERTUSSIS 5-2.5-18.5 LF-MCG/0.5 IM SUSP: 0.5000 mL | Freq: Once | INTRAMUSCULAR | Status: DC

## 2017-03-14 NOTE — Anesthesia Pain Management Evaluation Note (Signed)
  CRNA Pain Management Visit Note  Patient: Jackie [name], 27 y.o., female  "Hello I am a member of the anesthesia team at [facility]. We have an anesthesia team available at all times to provide care throughout the hospital, including epidural management and anesthesia for C-section. I don't know your plan for the delivery whether it a natural birth, water birth, IV sedation, nitrous supplementation, doula or epidural, but we want to meet your pain goals."   1.Was your pain managed to your expectations on prior hospitalizations?   Yes   2.What is your expectation for pain management during this hospitalization?     Epidural  3.How can we help you reach that goal? epidural  Record the patient's initial score and the patient's pain goal.   Pain: 2  Pain Goal: 4 [facility] wants you to be able to say your pain was always managed very well.  [name] 03/14/2017

## 2017-03-14 NOTE — H&P (Signed)
Jackie [name] is a 27 y.o. female presenting for IOL.  H/o hypothyroid and stable on synthroid dose.  She denies contractions/lof/vb and has noted fm.  She has now been on pitocin and had srom - cntractions getting more painful. PNCare at [place] since 11 wks History OB History    Gravida Para Term Preterm AB Living   2       1     SAB TAB Ectopic Multiple Live Births   1             Past Medical History:  Diagnosis Date  . Chondromalacia of right patella 10/2013  . Compartment syndrome, nontraumatic, lower extremity 10/2013   right  . Hypothyroidism   . Migraines    Past Surgical History:  Procedure Laterality Date  . FASCIOTOMY Right 10/30/2013   Procedure: RELEASE RIGHT ANTERIOR COMPARTMENT FASCIOTOMY ;  Surgeon: Ninetta Lights, MD;  Location: [facility];  Service: Orthopedics;  Laterality: Right;  . KNEE ARTHROSCOPY W/ PLICA EXCISION Left 04/12/8379  . KNEE ARTHROSCOPY WITH FULKERSON SLIDE Right 10/30/2013   Procedure: RIGHT KNEE SCOPE LATERAL RELEASE/CHONDROPLASTY ANTERIOR TIBIAL TUBERCLEPLASTY;  Surgeon: Ninetta Lights, MD;  Location: [facility];  Service: Orthopedics;  Laterality: Right;  ANESTHESIA:  GENERAL, PRE/POST OP FEMORAL NERVE  . KNEE ARTHROSCOPY WITH LATERAL RELEASE Left 07/24/2013   Procedure: LEFT ARTHROSCOPY KNEE WITH LATERAL RELEASE,WITH DEBRIDEMENT/SHAVING (CHONDROPLASTY) ANTERIOR TIBIAL TUBERCLEPLASTY;  Surgeon: Ninetta Lights, MD;  Location: [facility];  Service: Orthopedics;  Laterality: Left;   Family History: family history includes Diabetes in her paternal grandmother; Migraines in her father; Thyroid disease in her mother. Social History:  reports that  has never smoked. she has never used smokeless tobacco. She reports that she does not drink alcohol or use drugs.  ROS  Dilation: 4.5 Effacement (%): 60 Station: -2 Exam by:: Mikey College, RN Blood pressure 132/80, pulse 70, temperature 98.4 F  (36.9 C), temperature source Oral, resp. rate 16, height 5' 7"  (1.702 m), weight 187 lb 6.4 oz (85 kg). Exam Physical Exam  Prenatal labs: ABO, Rh: --/--/O POS, O POS Performed at [facility], [address], [place], [place] [address]  [phone]) Antibody: NEG (03/06 0340) Rubella: Nonimmune (08/27 0000) RPR: Nonreactive (08/27 0000)  HBsAg: Negative (08/27 0000)  HIV: Non-reactive (08/27 0000)  GBS: Negative (02/11 0000)  1 hr Glucola - passed Genetic screening - neg Anatomy US: normal  Physical Exam:  A&O x 3 HEENT : grossly wnl Lungs : ctab CV rrr Abdo : soft, nt, gravid Extr : no edema, nt bilat Pelvic ; 4.5/60/-2 per nurse (exam just done so deferred at this time)  FHT: 130s, +accels, no decels, nml variability TOCO: irregular  Assessment/Plan:  iup at 39.0 1. Admit and contin pit IOL; plan svd 2. Hypothyroid: stable  3. gbs neg 4. Rh pos 5. Rubella NI, mmr pp   Charyl Bigger 03/14/2017, 7:25 AM

## 2017-03-14 NOTE — Anesthesia Preprocedure Evaluation (Signed)
Anesthesia Evaluation  Patient identified by MRN, date of birth, ID band Patient awake    Reviewed: Allergy & Precautions, H&P , NPO status , Patient's Chart, lab work & pertinent test results  Airway Mallampati: I  TM Distance: >3 FB Neck ROM: full    Dental no notable dental hx. (+) Teeth Intact   Pulmonary neg pulmonary ROS,    Pulmonary exam normal breath sounds clear to auscultation       Cardiovascular negative cardio ROS Normal cardiovascular exam Rhythm:regular Rate:Normal     Neuro/Psych negative psych ROS   GI/Hepatic negative GI ROS, Neg liver ROS,   Endo/Other  Hypothyroidism   Renal/GU negative Renal ROS  negative genitourinary   Musculoskeletal negative musculoskeletal ROS (+)   Abdominal Normal abdominal exam  (+)   Peds  Hematology negative hematology ROS (+)   Anesthesia Other Findings   Reproductive/Obstetrics (+) Pregnancy                             Anesthesia Physical Anesthesia Plan  ASA: II  Anesthesia Plan: Epidural   Post-op Pain Management:    Induction:   PONV Risk Score and Plan:   Airway Management Planned:   Additional Equipment:   Intra-op Plan:   Post-operative Plan:   Informed Consent: I have reviewed the patients History and Physical, chart, labs and discussed the procedure including the risks, benefits and alternatives for the proposed anesthesia with the patient or authorized representative who has indicated his/her understanding and acceptance.     Plan Discussed with:   Anesthesia Plan Comments:         Anesthesia Quick Evaluation

## 2017-03-14 NOTE — Anesthesia Postprocedure Evaluation (Signed)
Anesthesia Post Note  Patient: Jackie [name]  Procedure(s) Performed: AN AD HOC LABOR EPIDURAL     Patient location during evaluation: Mother Baby Anesthesia Type: Epidural Level of consciousness: awake and alert Pain management: pain level controlled Vital Signs Assessment: post-procedure vital signs reviewed and stable Respiratory status: spontaneous breathing, nonlabored ventilation and respiratory function stable Cardiovascular status: stable Postop Assessment: no headache, no backache and epidural receding Anesthetic complications: no    Last Vitals:  Vitals:   03/14/17 1337 03/14/17 1433  BP: 112/66 109/68  Pulse: 78 97  Resp: 16 16  Temp: 37.1 C 37.2 C  SpO2: 100% 98%    Last Pain:  Vitals:   03/14/17 1433  TempSrc: Oral  PainSc: 0-No pain   Pain Goal:                 EchoStarMERRITT,[name]

## 2017-03-14 NOTE — Anesthesia Procedure Notes (Signed)
Epidural Patient location during procedure: OB Start time: 03/14/2017 8:25 AM End time: 03/14/2017 8:29 AM  Staffing Anesthesiologist: Leilani AbleHatchett, Alfretta Pinch, MD Performed: anesthesiologist   Preanesthetic Checklist Completed: patient identified, site marked, surgical consent, pre-op evaluation, timeout performed, IV checked, risks and benefits discussed and monitors and equipment checked  Epidural Patient position: sitting Prep: site prepped and draped and DuraPrep Patient monitoring: continuous pulse ox and blood pressure Approach: midline Location: L3-L4 Injection technique: LOR air  Needle:  Needle type: Tuohy  Needle gauge: 17 G Needle length: 9 cm and 9 Needle insertion depth: 5 cm cm Catheter type: closed end flexible Catheter size: 19 Gauge Catheter at skin depth: 9 cm Test dose: negative and Other  Assessment Sensory level: T10 Events: blood not aspirated, injection not painful, no injection resistance, negative IV test and no paresthesia  Additional Notes Reason for block:procedure for pain

## 2017-03-15 NOTE — Discharge Summary (Signed)
OB Discharge Summary  Patient Name: Jackie [name] DOB: 07-12-1990 MRN: [id number]  Date of admission: 03/14/2017 Delivering MD: Rhoderick MoodyALMQUIST, SUSAN E   Date of discharge: 03/15/2017  Admitting diagnosis: 39WKS INDUCTION Intrauterine pregnancy: 65106w1d     Secondary diagnosis:Principal Problem:   Postpartum care following vaginal delivery (3/6) Active Problems:   Encounter for planned induction of labor   SVD (spontaneous vaginal delivery)  Additional problems: None     Discharge diagnosis: Term Pregnancy Delivered                                                                     Post partum procedures:None  Augmentation: Pitocin  Complications: None  Hospital course:  Induction of Labor With Vaginal Delivery   27 y.o. yo G2P0010 at 62106w1d was admitted to the hospital 03/14/2017 for induction of labor.  Indication for induction: Favorable cervix at term.  Patient had an uncomplicated labor course as follows: Membrane Rupture Time/Date: 6:30 AM ,03/14/2017   Intrapartum Procedures: Episiotomy: None [1]                                         Lacerations:  Labial [10];Vaginal [6];1st degree [2]  Patient had delivery of a Viable infant.  Information for the patient's newborn:  Donnetta SimpersCox, Boy Karolyn [[id number]][[id number]]  Delivery Method: Vag-Spont   03/14/2017  Details of delivery can be found in separate delivery note.  Patient had a routine postpartum course. Patient is discharged home 03/15/17.  Physical exam  Vitals:   03/14/17 1433 03/14/17 1828 03/14/17 2208 03/15/17 0502  BP: 109/68 112/69 125/71 112/69  Pulse: 97 77 86 74  Resp: 16 16 18 18   Temp: 99 F (37.2 C) 98.2 F (36.8 C) 98.4 F (36.9 C) 98.4 F (36.9 C)  TempSrc: Oral Oral Axillary Oral  SpO2: 98% 99%    Weight:      Height:       General: alert, cooperative and no distress Lochia: appropriate Uterine Fundus: firm Incision: N/A DVT Evaluation: No evidence of DVT seen on physical exam. Labs: Lab Results  Component  Value Date   WBC 9.4 03/14/2017   HGB 13.7 03/14/2017   HCT 38.7 03/14/2017   MCV 88.4 03/14/2017   PLT 177 03/14/2017   CMP Latest Ref Rng & Units 05/08/2014  Glucose 65 - 99 mg/dL 82  BUN 6 - 20 mg/dL 12  Creatinine 2.840.57 - 1.321.00 mg/dL 4.400.68  Sodium 102134 - 725144 mmol/L 142  Potassium 3.5 - 5.2 mmol/L 4.3  Chloride 97 - 108 mmol/L 103  CO2 18 - 29 mmol/L 20  Calcium 8.7 - 10.2 mg/dL 9.7  Total Protein 6.0 - 8.5 g/dL 6.7  Total Bilirubin 0.0 - 1.2 mg/dL 0.4  Alkaline Phos 39 - 117 IU/L 96  AST 0 - 40 IU/L 11  ALT 0 - 32 IU/L 12    Discharge instruction: per After Visit Summary and "Baby and Me Booklet".  After Visit Meds:  Allergies as of 03/15/2017      Reactions   Topamax [topiramate] Other (See Comments)   NUMBNESS OF HANDS AND FACE  Medication List    STOP taking these medications   azithromycin 250 MG tablet Commonly known as:  ZITHROMAX   DIFLUCAN PO   terconazole 0.8 % vaginal cream Commonly known as:  TERAZOL 3     TAKE these medications   acetaminophen 500 MG tablet Commonly known as:  TYLENOL Take 1,000 mg by mouth every 6 (six) hours as needed for headache.   levothyroxine 75 MCG tablet Commonly known as:  SYNTHROID, LEVOTHROID Take 75 mcg by mouth daily before breakfast.   pantoprazole 40 MG tablet Commonly known as:  PROTONIX Take 40 mg by mouth daily.   prenatal multivitamin Tabs tablet Take 1 tablet by mouth daily at 12 noon.       Diet: routine diet  Activity: Advance as tolerated. Pelvic rest for 6 weeks.   Outpatient follow up:6 weeks Follow up Appt:No future appointments. Follow up visit: No Follow-up on file.  Postpartum contraception: IUD Mirena  Newborn Data: Live born female  Birth Weight: 6 lb 10.7 oz (3025 g) APGAR: 9, 9  Newborn Delivery   Birth date/time:  03/14/2017 11:28:00 Delivery type:  Vaginal, Spontaneous     Baby Feeding: Breast Disposition:home with mother   03/15/2017 [name], MD

## 2017-03-15 NOTE — Progress Notes (Signed)
Post Partum Day 1 S/P spontaneous vaginal  Feeding: breast Subjective: No HA, SOB, CP, F/C, breast symptoms. Normal vaginal bleeding, no clots. Pain controlled.  ambulating without symptoms.  voiding without difficulty    Objective: BP 112/69 (BP Location: Left Arm)   Pulse 74   Temp 98.4 F (36.9 C) (Oral)   Resp 18   Ht 5\' 7"  (1.702 m)   Wt 85 kg (187 lb 6.4 oz)   SpO2 99%   BMI 29.35 kg/m  I&O reviewed.   Physical Exam:  General: alert and no distress Lochia: appropriate Uterine Fundus: firm DVT Evaluation: No evidence of DVT seen on physical exam. Ext: No c/c/e Recent Labs    03/14/17 0101  HGB 13.7  HCT 38.7      Assessment/Plan: 27 y.o.  PPD #1 .  normal postpartum exam Continue current postpartum care Ambulate   LOS: 1 day   Jackie [name] 03/15/2017 10:58 AM

## 2017-04-25 DIAGNOSIS — Z30430 Encounter for insertion of intrauterine contraceptive device: Secondary | ICD-10-CM | POA: Diagnosis not present

## 2017-04-25 DIAGNOSIS — E039 Hypothyroidism, unspecified: Secondary | ICD-10-CM | POA: Diagnosis not present

## 2017-04-25 DIAGNOSIS — Z124 Encounter for screening for malignant neoplasm of cervix: Secondary | ICD-10-CM | POA: Diagnosis not present

## 2017-04-26 DIAGNOSIS — N854 Malposition of uterus: Secondary | ICD-10-CM | POA: Diagnosis not present

## 2017-04-26 DIAGNOSIS — Z30430 Encounter for insertion of intrauterine contraceptive device: Secondary | ICD-10-CM | POA: Diagnosis not present

## 2017-04-26 DIAGNOSIS — Z3202 Encounter for pregnancy test, result negative: Secondary | ICD-10-CM | POA: Diagnosis not present

## 2017-05-23 DIAGNOSIS — Z30431 Encounter for routine checking of intrauterine contraceptive device: Secondary | ICD-10-CM | POA: Diagnosis not present

## 2017-06-13 ENCOUNTER — Encounter (HOSPITAL_COMMUNITY): Payer: Self-pay

## 2017-07-26 DIAGNOSIS — M9901 Segmental and somatic dysfunction of cervical region: Secondary | ICD-10-CM | POA: Diagnosis not present

## 2017-07-26 DIAGNOSIS — M9903 Segmental and somatic dysfunction of lumbar region: Secondary | ICD-10-CM | POA: Diagnosis not present

## 2017-07-26 DIAGNOSIS — M9905 Segmental and somatic dysfunction of pelvic region: Secondary | ICD-10-CM | POA: Diagnosis not present

## 2017-07-26 DIAGNOSIS — M5413 Radiculopathy, cervicothoracic region: Secondary | ICD-10-CM | POA: Diagnosis not present

## 2017-10-05 DIAGNOSIS — H40013 Open angle with borderline findings, low risk, bilateral: Secondary | ICD-10-CM | POA: Diagnosis not present

## 2017-12-11 DIAGNOSIS — Z01419 Encounter for gynecological examination (general) (routine) without abnormal findings: Secondary | ICD-10-CM | POA: Diagnosis not present

## 2017-12-11 DIAGNOSIS — Z6828 Body mass index (BMI) 28.0-28.9, adult: Secondary | ICD-10-CM | POA: Diagnosis not present

## 2017-12-11 DIAGNOSIS — E039 Hypothyroidism, unspecified: Secondary | ICD-10-CM | POA: Diagnosis not present

## 2018-07-24 DIAGNOSIS — Z30432 Encounter for removal of intrauterine contraceptive device: Secondary | ICD-10-CM | POA: Diagnosis not present

## 2018-08-12 DIAGNOSIS — Z3200 Encounter for pregnancy test, result unknown: Secondary | ICD-10-CM | POA: Diagnosis not present

## 2018-08-12 DIAGNOSIS — Z3689 Encounter for other specified antenatal screening: Secondary | ICD-10-CM | POA: Diagnosis not present

## 2018-08-12 LAB — OB RESULTS CONSOLE RUBELLA ANTIBODY, IGM: Rubella: IMMUNE

## 2018-08-12 LAB — OB RESULTS CONSOLE ANTIBODY SCREEN: Antibody Screen: NEGATIVE

## 2018-08-12 LAB — OB RESULTS CONSOLE HIV ANTIBODY (ROUTINE TESTING): HIV: NONREACTIVE

## 2018-08-12 LAB — OB RESULTS CONSOLE ABO/RH: RH Type: POSITIVE

## 2018-08-12 LAB — OB RESULTS CONSOLE HEPATITIS B SURFACE ANTIGEN: Hepatitis B Surface Ag: NEGATIVE

## 2018-08-12 LAB — OB RESULTS CONSOLE RPR: RPR: NONREACTIVE

## 2018-09-03 DIAGNOSIS — Z3201 Encounter for pregnancy test, result positive: Secondary | ICD-10-CM | POA: Diagnosis not present

## 2018-09-30 DIAGNOSIS — R81 Glycosuria: Secondary | ICD-10-CM | POA: Diagnosis not present

## 2018-09-30 DIAGNOSIS — Z1329 Encounter for screening for other suspected endocrine disorder: Secondary | ICD-10-CM | POA: Diagnosis not present

## 2018-09-30 DIAGNOSIS — Z113 Encounter for screening for infections with a predominantly sexual mode of transmission: Secondary | ICD-10-CM | POA: Diagnosis not present

## 2018-09-30 DIAGNOSIS — Z118 Encounter for screening for other infectious and parasitic diseases: Secondary | ICD-10-CM | POA: Diagnosis not present

## 2018-09-30 DIAGNOSIS — Z01419 Encounter for gynecological examination (general) (routine) without abnormal findings: Secondary | ICD-10-CM | POA: Diagnosis not present

## 2018-09-30 DIAGNOSIS — Z3689 Encounter for other specified antenatal screening: Secondary | ICD-10-CM | POA: Diagnosis not present

## 2018-09-30 DIAGNOSIS — Z3481 Encounter for supervision of other normal pregnancy, first trimester: Secondary | ICD-10-CM | POA: Diagnosis not present

## 2018-10-17 DIAGNOSIS — Z3682 Encounter for antenatal screening for nuchal translucency: Secondary | ICD-10-CM | POA: Diagnosis not present

## 2018-10-17 DIAGNOSIS — Z3A13 13 weeks gestation of pregnancy: Secondary | ICD-10-CM | POA: Diagnosis not present

## 2018-10-17 DIAGNOSIS — Z01419 Encounter for gynecological examination (general) (routine) without abnormal findings: Secondary | ICD-10-CM | POA: Diagnosis not present

## 2018-11-06 DIAGNOSIS — Z3482 Encounter for supervision of other normal pregnancy, second trimester: Secondary | ICD-10-CM | POA: Diagnosis not present

## 2018-11-06 DIAGNOSIS — Z361 Encounter for antenatal screening for raised alphafetoprotein level: Secondary | ICD-10-CM | POA: Diagnosis not present

## 2018-11-25 DIAGNOSIS — Z3482 Encounter for supervision of other normal pregnancy, second trimester: Secondary | ICD-10-CM | POA: Diagnosis not present

## 2018-11-25 DIAGNOSIS — Z363 Encounter for antenatal screening for malformations: Secondary | ICD-10-CM | POA: Diagnosis not present

## 2018-12-17 DIAGNOSIS — R42 Dizziness and giddiness: Secondary | ICD-10-CM | POA: Diagnosis not present

## 2018-12-17 DIAGNOSIS — Z3A21 21 weeks gestation of pregnancy: Secondary | ICD-10-CM | POA: Diagnosis not present

## 2018-12-17 DIAGNOSIS — O26892 Other specified pregnancy related conditions, second trimester: Secondary | ICD-10-CM | POA: Diagnosis not present

## 2018-12-23 DIAGNOSIS — Z362 Encounter for other antenatal screening follow-up: Secondary | ICD-10-CM | POA: Diagnosis not present

## 2018-12-23 DIAGNOSIS — Z3482 Encounter for supervision of other normal pregnancy, second trimester: Secondary | ICD-10-CM | POA: Diagnosis not present

## 2019-01-21 ENCOUNTER — Other Ambulatory Visit: Payer: Self-pay

## 2019-01-21 ENCOUNTER — Observation Stay (HOSPITAL_COMMUNITY)
Admission: AD | Admit: 2019-01-21 | Discharge: 2019-01-22 | Disposition: A | Discharge status: Home / Self Care | Payer: BC Managed Care – PPO | Attending: Obstetrics & Gynecology | Admitting: Obstetrics & Gynecology

## 2019-01-21 ENCOUNTER — Encounter (HOSPITAL_COMMUNITY): Payer: Self-pay | Admitting: Obstetrics & Gynecology

## 2019-01-21 DIAGNOSIS — Z20822 Contact with and (suspected) exposure to covid-19: Secondary | ICD-10-CM | POA: Diagnosis not present

## 2019-01-21 DIAGNOSIS — O26852 Spotting complicating pregnancy, second trimester: Secondary | ICD-10-CM | POA: Diagnosis not present

## 2019-01-21 DIAGNOSIS — O4702 False labor before 37 completed weeks of gestation, second trimester: Secondary | ICD-10-CM | POA: Diagnosis present

## 2019-01-21 DIAGNOSIS — O26892 Other specified pregnancy related conditions, second trimester: Secondary | ICD-10-CM | POA: Diagnosis not present

## 2019-01-21 DIAGNOSIS — E039 Hypothyroidism, unspecified: Secondary | ICD-10-CM | POA: Insufficient documentation

## 2019-01-21 DIAGNOSIS — Z3A26 26 weeks gestation of pregnancy: Secondary | ICD-10-CM | POA: Insufficient documentation

## 2019-01-21 DIAGNOSIS — O26899 Other specified pregnancy related conditions, unspecified trimester: Secondary | ICD-10-CM | POA: Diagnosis not present

## 2019-01-21 LAB — CBC
HCT: 39 % (ref 36.0–46.0)
Hemoglobin: 13.7 g/dL (ref 12.0–15.0)
MCH: 31.2 pg (ref 26.0–34.0)
MCHC: 35.1 g/dL (ref 30.0–36.0)
MCV: 88.8 fL (ref 80.0–100.0)
Platelets: 194 10*3/uL (ref 150–400)
RBC: 4.39 MIL/uL (ref 3.87–5.11)
RDW: 13 % (ref 11.5–15.5)
WBC: 12.9 10*3/uL — ABNORMAL HIGH (ref 4.0–10.5)
nRBC: 0 % (ref 0.0–0.2)

## 2019-01-21 LAB — TYPE AND SCREEN
ABO/RH(D): O POS
Antibody Screen: NEGATIVE

## 2019-01-21 LAB — ABO/RH: ABO/RH(D): O POS

## 2019-01-21 LAB — SARS CORONAVIRUS 2 (TAT 6-24 HRS): SARS Coronavirus 2: NEGATIVE

## 2019-01-21 MED ORDER — INDOMETHACIN 25 MG PO CAPS
50.0000 mg | ORAL_CAPSULE | Freq: Once | ORAL | Status: AC
  Administered 2019-01-21: 18:00:00 50 mg via ORAL
  Filled 2019-01-21: qty 2

## 2019-01-21 MED ORDER — BETAMETHASONE SOD PHOS & ACET 6 (3-3) MG/ML IJ SUSP
12.0000 mg | Freq: Once | INTRAMUSCULAR | Status: AC
  Administered 2019-01-22: 15:00:00 12 mg via INTRAMUSCULAR
  Filled 2019-01-21: qty 5

## 2019-01-21 MED ORDER — CALCIUM CARBONATE ANTACID 500 MG PO CHEW
2.0000 | CHEWABLE_TABLET | ORAL | Status: DC | PRN
  Administered 2019-01-21: 23:00:00 400 mg via ORAL
  Filled 2019-01-21: qty 2

## 2019-01-21 MED ORDER — INDOMETHACIN 25 MG PO CAPS
25.0000 mg | ORAL_CAPSULE | Freq: Four times a day (QID) | ORAL | Status: DC
  Administered 2019-01-21 – 2019-01-22 (×3): 25 mg via ORAL
  Filled 2019-01-21 (×3): qty 1

## 2019-01-21 MED ORDER — PRENATAL MULTIVITAMIN CH
1.0000 | ORAL_TABLET | Freq: Every day | ORAL | Status: DC
  Administered 2019-01-22: 1 via ORAL
  Filled 2019-01-21: qty 1

## 2019-01-21 NOTE — H&P (Signed)
Jackie [name] is a 29 y.o. female G2P1001, presenting from office for observation for threatened preterm labor at 26.6 wks.  Patient presented to office with c/o heavier vaginal discharge, tinge of bleeding and then yellow vag discharge and pelvic pressure. Denies UCs or LOF. Good FMs. Cervix was soft and dilated at 1 cm per exam by Dr Ernestina Penna. 1st pregnancy with h/o cervical dilatation gradually over last few weeks in pregnancy and rapid labor. She had FFN test collected before exam and she got 1st dose of BTMZ at around 3 pm in office.   PNCare with Dr Ernestina Penna since 6-7 wks, [facility] 04/23/19 by 1st trim sono. H/o Hypothyroidism in 1st pregnancy, none since and no meds, normal TSH in this pregnancy. Dizziness in pregnancy with normal BPs, possible sinus, possible benign positional vertigo, saw PCP as well. H/o Migraines.  Anatomy sono nl.   OB History    Gravida  2   Para      Term      Preterm      AB  1   Living        SAB  1   TAB      Ectopic      Multiple      Live Births             Past Medical History:  Diagnosis Date  . Chondromalacia of right patella 10/2013  . Compartment syndrome, nontraumatic, lower extremity 10/2013   right  . Hypothyroidism   . Migraines    Past Surgical History:  Procedure Laterality Date  . FASCIOTOMY Right 10/30/2013   Procedure: RELEASE RIGHT ANTERIOR COMPARTMENT FASCIOTOMY ;  Surgeon: Loreta Ave, MD;  Location: [facility];  Service: Orthopedics;  Laterality: Right;  . KNEE ARTHROSCOPY W/ PLICA EXCISION Left 07/12/2010  . KNEE ARTHROSCOPY WITH FULKERSON SLIDE Right 10/30/2013   Procedure: RIGHT KNEE SCOPE LATERAL RELEASE/CHONDROPLASTY ANTERIOR TIBIAL TUBERCLEPLASTY;  Surgeon: Loreta Ave, MD;  Location: [facility];  Service: Orthopedics;  Laterality: Right;  ANESTHESIA:  GENERAL, PRE/POST OP FEMORAL NERVE  . KNEE ARTHROSCOPY WITH LATERAL RELEASE Left 07/24/2013   Procedure: LEFT ARTHROSCOPY KNEE  WITH LATERAL RELEASE,WITH DEBRIDEMENT/SHAVING (CHONDROPLASTY) ANTERIOR TIBIAL TUBERCLEPLASTY;  Surgeon: Loreta Ave, MD;  Location: [facility];  Service: Orthopedics;  Laterality: Left;   Family History: family history includes Diabetes in her paternal grandmother; Migraines in her father; Thyroid disease in her mother. Social History:  reports that she has never smoked. She has never used smokeless tobacco. She reports that she does not drink alcohol or use drugs.     Maternal Diabetes: Neg screen in 1st trim. 3rd trim screen pending.  Genetic Screening: Normal Ultrascreen and AFP1  Maternal Ultrasounds/Referrals: Normal Fetal Ultrasounds or other Referrals:  None Maternal Substance Abuse:  No Significant Maternal Medications:  None Significant Maternal Lab Results:  A1C 4.7, TSH nl . GBS not done yet Other Comments:  None  Review of Systems neg History   Blood pressure 124/75, pulse 82, temperature 98 F (36.7 C), temperature source Oral, resp. rate 18, height 5\' 7"  (1.702 m), weight 82.1 kg, SpO2 99 %, unknown if currently breastfeeding. Exam Physical Exam  A&O x 3, no acute distress. Pleasant HEENT neg, no thyromegaly Lungs CTA bilat CV RRR, S1S2 normal Abdo soft, non tender, non acute Extr no edema/ tenderness Pelvic deferred repeat cervical exam since no worsening symptoms. No UCs FHT  145-150/ 10 bt variability/ + accels/ no decels -  reactive  Toco None   Prenatal labs: ABO, Rh:  O+ Antibody:  Neg Rubella:  Immune RPR:   NR HBsAg:   Neg HIV:   Neg GBS:   not done yet   Assessment/Plan: 29 yo G2P1001, 26.6 wks (1st trim sono dating). Admitted with cervical dilatation of 1 cm with threatened preterm labor Admit for observation. Toco 1hr every 8hrs and sooner if pt feels UCs. NST 30 min every 8hrs BMTZ 2nd dose tomorrow at 3 pm Indomethacin 50mg  load, 25 mg q 6 hrs x 48hrs Defer magnesium unless symptomatic contractions CL sono tomorrow to assess  if vaginal Prometrium candidate  FFN sent from office, will f/up 3rd trim labs and TDAP outpatient after 1 week   Elveria Royals 01/21/2019, 5:32 PM

## 2019-01-22 ENCOUNTER — Observation Stay (HOSPITAL_COMMUNITY): Payer: BC Managed Care – PPO

## 2019-01-22 ENCOUNTER — Encounter (HOSPITAL_COMMUNITY): Payer: Self-pay | Admitting: *Deleted

## 2019-01-22 ENCOUNTER — Observation Stay (HOSPITAL_BASED_OUTPATIENT_CLINIC_OR_DEPARTMENT_OTHER): Payer: BC Managed Care – PPO

## 2019-01-22 DIAGNOSIS — O4700 False labor before 37 completed weeks of gestation, unspecified trimester: Secondary | ICD-10-CM | POA: Diagnosis not present

## 2019-01-22 DIAGNOSIS — Z20822 Contact with and (suspected) exposure to covid-19: Secondary | ICD-10-CM | POA: Diagnosis not present

## 2019-01-22 DIAGNOSIS — O26852 Spotting complicating pregnancy, second trimester: Secondary | ICD-10-CM | POA: Diagnosis not present

## 2019-01-22 DIAGNOSIS — Z3A26 26 weeks gestation of pregnancy: Secondary | ICD-10-CM | POA: Diagnosis not present

## 2019-01-22 DIAGNOSIS — Z3A27 27 weeks gestation of pregnancy: Secondary | ICD-10-CM

## 2019-01-22 DIAGNOSIS — E039 Hypothyroidism, unspecified: Secondary | ICD-10-CM | POA: Diagnosis not present

## 2019-01-22 NOTE — Discharge Instructions (Signed)

## 2019-01-22 NOTE — Progress Notes (Signed)
Patient ID: Jackie [name], female   DOB: [birth year], 29 y.o.   MRN: [id number] HD #2 Threatened preterm labor, 27 wks.  No new events overnight, could sleep due to having to stay away from her 29 yr old. No UCs/ VB/LOF. Good FMs  BP 119/68 (BP Location: Right Arm)   Pulse 98   Temp 98.5 F (36.9 C) (Oral)   Resp 18   Ht 5\' 7"  (1.702 m)   Wt 82.1 kg   SpO2 98%   BMI 28.35 kg/m  FHT 140s + accels/ no decels / 10x10 variability- reactive at 27wks Toco none  Abdomen soft, uterus non tender Pelvic exam deferred   FFN (office) Negative Sono-  AFI nl, CL 3.4 cm, no funel. Active fetus.   A/P: Threatened PTL, 27 wks, stable, no active contractions.  BTMZ #2 today at 3 pm and discharge home after that   3rd trim labs, TDAP and visit with Dr in 1 week  Pelvic rest, no lifting, work from home if possible till 32 weeks and reassess status at that point  Labor s/s reviewed, FAC reviewed.   --V.Jenesis Martin MD

## 2019-01-22 NOTE — Discharge Summary (Signed)
Physician Discharge Summary  Patient ID: Jackie [name] MRN: [id number] DOB/AGE: 1990/12/24 29 y.o.  Admit date: 01/21/2019 Discharge date: 01/22/2019  Admission Diagnoses:27.6 wks threatened preterm labor with cervical dilatation  Discharge Diagnoses:  Active Problems:   Threatened premature labor in second trimester 27 weeks, no active labor   , no active labor   Discharged Condition: good  Hospital Course: Uncomplicated. No contractions noted. FHT NST every hours- reactive.  Sono for cervical length normal at 3.4 cm, no funneling. Office FFN came back negative. She received Indomethacin 50mg  load and 25 mg every 6 hours after that. 2nd Betamethasone at 3 pm on 01/22/19 before discharge.    Disposition: Discharge disposition: 01-Home or Self Care       Discharge Instructions    Call MD for:   Complete by: As directed    Painful regular contractions/ vaginal bleeding/ leakage of watery fluid/ decreased fetal movements   Call MD for:  difficulty breathing, headache or visual disturbances   Complete by: As directed    Call MD for:  persistant nausea and vomiting   Complete by: As directed    Call MD for:  temperature >100.4   Complete by: As directed    Diet - low sodium heart healthy   Complete by: As directed    Increase activity slowly   Complete by: As directed    Lifting restrictions   Complete by: As directed    upto 25 lbs only till 36 wks   Sexual Activity Restrictions   Complete by: As directed    Until 36 weeks     Allergies as of 01/22/2019      Reactions   Topamax [topiramate] Other (See Comments)   NUMBNESS OF HANDS AND FACE      Medication List    TAKE these medications   acetaminophen 500 MG tablet Commonly known as: TYLENOL Take 1,000 mg by mouth every 6 (six) hours as needed for headache.   calcium carbonate 500 MG chewable tablet Commonly known as: TUMS - dosed in mg elemental calcium Chew 2 tablets by mouth 2 (two) times daily as needed for indigestion or  heartburn.   prenatal multivitamin Tabs tablet Take 1 tablet by mouth daily at 12 noon.      Follow-up Information    01/24/2019, MD Follow up in 1 week(s).   Specialty: Obstetrics and Gynecology Contact information: [address] [place] [phone]           Signed: [phone] 01/22/2019, 12:36 PM

## 2019-01-27 DIAGNOSIS — Z3A27 27 weeks gestation of pregnancy: Secondary | ICD-10-CM | POA: Diagnosis not present

## 2019-01-27 DIAGNOSIS — O26899 Other specified pregnancy related conditions, unspecified trimester: Secondary | ICD-10-CM | POA: Diagnosis not present

## 2019-01-27 DIAGNOSIS — O26893 Other specified pregnancy related conditions, third trimester: Secondary | ICD-10-CM | POA: Diagnosis not present

## 2019-01-27 DIAGNOSIS — N898 Other specified noninflammatory disorders of vagina: Secondary | ICD-10-CM | POA: Diagnosis not present

## 2019-01-30 DIAGNOSIS — Z3A28 28 weeks gestation of pregnancy: Secondary | ICD-10-CM | POA: Diagnosis not present

## 2019-01-30 DIAGNOSIS — Z3689 Encounter for other specified antenatal screening: Secondary | ICD-10-CM | POA: Diagnosis not present

## 2019-01-30 DIAGNOSIS — O26893 Other specified pregnancy related conditions, third trimester: Secondary | ICD-10-CM | POA: Diagnosis not present

## 2019-01-30 DIAGNOSIS — Z23 Encounter for immunization: Secondary | ICD-10-CM | POA: Diagnosis not present

## 2019-02-06 DIAGNOSIS — Z3483 Encounter for supervision of other normal pregnancy, third trimester: Secondary | ICD-10-CM | POA: Diagnosis not present

## 2019-02-06 DIAGNOSIS — Z3482 Encounter for supervision of other normal pregnancy, second trimester: Secondary | ICD-10-CM | POA: Diagnosis not present

## 2019-02-14 DIAGNOSIS — O26893 Other specified pregnancy related conditions, third trimester: Secondary | ICD-10-CM | POA: Diagnosis not present

## 2019-02-14 DIAGNOSIS — Z3A30 30 weeks gestation of pregnancy: Secondary | ICD-10-CM | POA: Diagnosis not present

## 2019-02-28 DIAGNOSIS — O4700 False labor before 37 completed weeks of gestation, unspecified trimester: Secondary | ICD-10-CM | POA: Diagnosis not present

## 2019-02-28 DIAGNOSIS — Z3A32 32 weeks gestation of pregnancy: Secondary | ICD-10-CM | POA: Diagnosis not present

## 2019-02-28 DIAGNOSIS — O4703 False labor before 37 completed weeks of gestation, third trimester: Secondary | ICD-10-CM | POA: Diagnosis not present

## 2019-03-14 DIAGNOSIS — O4703 False labor before 37 completed weeks of gestation, third trimester: Secondary | ICD-10-CM | POA: Diagnosis not present

## 2019-03-14 DIAGNOSIS — Z3A34 34 weeks gestation of pregnancy: Secondary | ICD-10-CM | POA: Diagnosis not present

## 2019-03-20 ENCOUNTER — Other Ambulatory Visit: Payer: Self-pay

## 2019-03-20 ENCOUNTER — Encounter (HOSPITAL_COMMUNITY): Payer: Self-pay | Admitting: Obstetrics

## 2019-03-20 ENCOUNTER — Inpatient Hospital Stay (HOSPITAL_COMMUNITY)
Admission: AD | Admit: 2019-03-20 | Discharge: 2019-03-20 | Disposition: A | Discharge status: Home / Self Care | Payer: BC Managed Care – PPO | Attending: Obstetrics | Admitting: Obstetrics

## 2019-03-20 DIAGNOSIS — O26893 Other specified pregnancy related conditions, third trimester: Secondary | ICD-10-CM

## 2019-03-20 DIAGNOSIS — Z3A35 35 weeks gestation of pregnancy: Secondary | ICD-10-CM | POA: Insufficient documentation

## 2019-03-20 DIAGNOSIS — N898 Other specified noninflammatory disorders of vagina: Secondary | ICD-10-CM | POA: Diagnosis not present

## 2019-03-20 LAB — URINALYSIS, ROUTINE W REFLEX MICROSCOPIC
Bilirubin Urine: NEGATIVE
Glucose, UA: NEGATIVE mg/dL
Hgb urine dipstick: NEGATIVE
Ketones, ur: 20 mg/dL — AB
Nitrite: NEGATIVE
Protein, ur: 30 mg/dL — AB
Specific Gravity, Urine: 1.023 (ref 1.005–1.030)
pH: 6 (ref 5.0–8.0)

## 2019-03-20 LAB — WET PREP, GENITAL
Clue Cells Wet Prep HPF POC: NONE SEEN
Sperm: NONE SEEN
Trich, Wet Prep: NONE SEEN
Yeast Wet Prep HPF POC: NONE SEEN

## 2019-03-20 NOTE — MAU Provider Note (Signed)
First Provider Initiated Contact with Patient 03/20/19 1953       S: [name] is a 29 y.o. G3P0011 at [redacted]w[redacted]d  who presents to MAU today complaining of leaking of fluid for past two days. She reports that she has had a mucous-like discharge wetting her underwear for last 2 days. Denies wetting through to other clothes or dripping down legs. No large gush of fluid. She denies vaginal bleeding. She denies contractions. She reports normal fetal movement.    O: BP 117/78 (BP Location: Right Arm)   Pulse 82   Temp 99.2 F (37.3 C)   Resp 20   Ht 5\' 7"  (1.702 m)   Wt 85.8 kg   BMI 29.63 kg/m  GENERAL: Well-developed, well-nourished female in no acute distress.  HEAD: Normocephalic, atraumatic.  CHEST: Normal effort of breathing, regular heart rate ABDOMEN: Soft, nontender, gravid PELVIC: Normal external female genitalia. Vagina is pink and rugated. Cervix with normal contour, no lesions; visually closed. Normal discharge.  No pooling; thick white vaginal discharge noted throughout without evidence of rupture.   Cervical exam: Visually closed on speculum exam. No contracting, cervical exam deferred. Patient has appt tomorrow AM.      Fetal Monitoring: Baseline: 145 Variability: moderate Accelerations: present Decelerations: absent Contractions: none  Results for orders placed or performed during the hospital encounter of 03/20/19 (from the past 24 hour(s))  Wet prep, genital     Status: Abnormal   Collection Time: 03/20/19  7:59 PM   Specimen: Urine, Clean Catch  Result Value Ref Range   Yeast Wet Prep HPF POC NONE SEEN NONE SEEN   Trich, Wet Prep NONE SEEN NONE SEEN   Clue Cells Wet Prep HPF POC NONE SEEN NONE SEEN   WBC, Wet Prep HPF POC MANY (A) NONE SEEN   Sperm NONE SEEN      A: SIUP at [redacted]w[redacted]d  Membranes intact  Ferning negative No pooling of fluids on exam Wet Prep with without abnormality.  NST reactive   P: Discharge home with return precautions. Has f/u  tomorrow AM.  [redacted]w[redacted]d, MD 03/20/2019 8:28 PM

## 2019-03-20 NOTE — MAU Note (Signed)
PT SAYS YESTERDAY- SHE FELT A TRICKLE .  THEN TODAY EVERYTIME BABY MOVES  SHE FEELS WET . PNC WITH DR [facility] - SHE CALLED OFFICE TODAY -  TOLD TO COME IN.   VE  AT 26 WEEKS - 1 CM,  THEN AT 32 WEEKS  1-2 CM.   HAS BEEN ON BEDREST- NO SEX. .  LOWER BACK IS CRAMPING  AND ABD TIGHT.

## 2019-03-21 DIAGNOSIS — Z3685 Encounter for antenatal screening for Streptococcus B: Secondary | ICD-10-CM | POA: Diagnosis not present

## 2019-03-21 DIAGNOSIS — O26893 Other specified pregnancy related conditions, third trimester: Secondary | ICD-10-CM | POA: Diagnosis not present

## 2019-03-21 DIAGNOSIS — O2613 Low weight gain in pregnancy, third trimester: Secondary | ICD-10-CM | POA: Diagnosis not present

## 2019-03-21 DIAGNOSIS — Z3A35 35 weeks gestation of pregnancy: Secondary | ICD-10-CM | POA: Diagnosis not present

## 2019-03-27 ENCOUNTER — Inpatient Hospital Stay (HOSPITAL_COMMUNITY)
Admission: AD | Admit: 2019-03-27 | Discharge: 2019-03-27 | Disposition: A | Discharge status: Home / Self Care | Payer: BC Managed Care – PPO | Attending: Obstetrics & Gynecology | Admitting: Obstetrics & Gynecology

## 2019-03-27 ENCOUNTER — Other Ambulatory Visit: Payer: Self-pay

## 2019-03-27 ENCOUNTER — Encounter (HOSPITAL_COMMUNITY): Payer: Self-pay | Admitting: Obstetrics & Gynecology

## 2019-03-27 DIAGNOSIS — Z3A36 36 weeks gestation of pregnancy: Secondary | ICD-10-CM | POA: Insufficient documentation

## 2019-03-27 DIAGNOSIS — O368130 Decreased fetal movements, third trimester, not applicable or unspecified: Secondary | ICD-10-CM | POA: Diagnosis not present

## 2019-03-27 DIAGNOSIS — O368131 Decreased fetal movements, third trimester, fetus 1: Secondary | ICD-10-CM | POA: Diagnosis not present

## 2019-03-27 DIAGNOSIS — Z3689 Encounter for other specified antenatal screening: Secondary | ICD-10-CM

## 2019-03-27 NOTE — MAU Note (Signed)
Presents with c/o decreased FM, reports no movement since this morning despite eating & drinking.  Denies VB or LOF.

## 2019-03-27 NOTE — MAU Provider Note (Signed)
Patient Jackie [name] is 29 y.o.  G3P1011 At [redacted]w[redacted]d here with complaints of decreased fetal movements. She denies vaginal bleeding, vaginal discharge, LOF, elevated blood pressures or diabetes in pregnancy. She has had one prior NSVD.    History     CSN: [id number]  Arrival date and time: 03/27/19 1621   None     Chief Complaint  Patient presents with  . Decreased Fetal Movement   HPI Patient says that she felt like her baby stopped moving at 2 am this morning. She tried drinking water, moving around. She did 2 hours of fetal kick counts and then came into be seen.  OB History    Gravida  3   Para  1   Term  1   Preterm      AB  1   Living  1     SAB  1   TAB      Ectopic      Multiple      Live Births  1           Past Medical History:  Diagnosis Date  . Chondromalacia of right patella 10/2013  . Compartment syndrome, nontraumatic, lower extremity 10/2013   right  . Hypothyroidism   . Migraines     Past Surgical History:  Procedure Laterality Date  . FASCIOTOMY Right 10/30/2013   Procedure: RELEASE RIGHT ANTERIOR COMPARTMENT FASCIOTOMY ;  Surgeon: Loreta Ave, MD;  Location: [facility];  Service: Orthopedics;  Laterality: Right;  . KNEE ARTHROSCOPY W/ PLICA EXCISION Left 07/12/2010  . KNEE ARTHROSCOPY WITH FULKERSON SLIDE Right 10/30/2013   Procedure: RIGHT KNEE SCOPE LATERAL RELEASE/CHONDROPLASTY ANTERIOR TIBIAL TUBERCLEPLASTY;  Surgeon: Loreta Ave, MD;  Location: [facility];  Service: Orthopedics;  Laterality: Right;  ANESTHESIA:  GENERAL, PRE/POST OP FEMORAL NERVE  . KNEE ARTHROSCOPY WITH LATERAL RELEASE Left 07/24/2013   Procedure: LEFT ARTHROSCOPY KNEE WITH LATERAL RELEASE,WITH DEBRIDEMENT/SHAVING (CHONDROPLASTY) ANTERIOR TIBIAL TUBERCLEPLASTY;  Surgeon: Loreta Ave, MD;  Location: [facility];  Service: Orthopedics;  Laterality: Left;  . KNEE SURGERY Bilateral     Family History  Problem  Relation Age of Onset  . Thyroid disease Mother   . Diabetes Paternal Grandmother   . Migraines Father     Social History   Tobacco Use  . Smoking status: Never Smoker  . Smokeless tobacco: Never Used  Substance Use Topics  . Alcohol use: No    Comment: occasionally  . Drug use: No    Allergies:  Allergies  Allergen Reactions  . Topamax [Topiramate] Other (See Comments)    NUMBNESS OF HANDS AND FACE    Medications Prior to Admission  Medication Sig Dispense Refill Last Dose  . calcium carbonate (TUMS - DOSED IN MG ELEMENTAL CALCIUM) 500 MG chewable tablet Chew 2 tablets by mouth 2 (two) times daily as needed for indigestion or heartburn.   03/26/2019 at 1930  . pantoprazole (PROTONIX) 40 MG tablet Take 40 mg by mouth daily.   03/27/2019 at 0800  . Prenatal Vit-Fe Fumarate-FA (PRENATAL MULTIVITAMIN) TABS tablet Take 1 tablet by mouth daily at 12 noon.   03/27/2019 at 0800  . acetaminophen (TYLENOL) 500 MG tablet Take 1,000 mg by mouth every 6 (six) hours as needed for headache.   03/25/2019    Review of Systems  Constitutional: Negative.   HENT: Negative.   Respiratory: Negative.   Cardiovascular: Negative.   Gastrointestinal: Negative.   Genitourinary: Negative.  Musculoskeletal: Negative.   Neurological: Negative.   Hematological: Negative.    Physical Exam   Blood pressure 123/83, pulse 73, temperature 98.1 F (36.7 C), temperature source Oral, resp. rate 18, height 5\' 5"  (1.651 m), weight 86.3 kg, SpO2 99 %, unknown if currently breastfeeding.  Physical Exam  Constitutional: She is oriented to person, place, and time. She appears well-developed.  HENT:  Head: Normocephalic.  Respiratory: Effort normal.  GI: Soft.  Musculoskeletal:        General: Normal range of motion.     Cervical back: Normal range of motion.  Neurological: She is alert and oriented to person, place, and time.  Skin: Skin is warm and dry.    MAU Course  Procedures  MDM -NST: 130  bpm, mod var, present acel, neg decels, no contractions.   Patient feels strong fetal movement while in MAU.    Assessment and Plan   1. NST (non-stress test) reactive    2. Reviewed fetal kick counts with patient; reviewed warning signs and when to return to MAU.   3. Patient stable for discharge with strict return precautions.    Mervyn Skeeters Amijah Timothy 03/27/2019, 5:19 PM

## 2019-03-27 NOTE — Discharge Instructions (Signed)
Fetal Movement Counts Patient Name: ________________________________________________ Patient Due Date: ____________________ What is a fetal movement count?  A fetal movement count is the number of times that you feel your baby move during a certain amount of time. This may also be called a fetal kick count. A fetal movement count is recommended for every pregnant woman. You may be asked to start counting fetal movements as early as week 28 of your pregnancy. Pay attention to when your baby is most active. You may notice your baby's sleep and wake cycles. You may also notice things that make your baby move more. You should do a fetal movement count:  When your baby is normally most active.  At the same time each day. A good time to count movements is while you are resting, after having something to eat and drink. How do I count fetal movements? 1. Find a quiet, comfortable area. Sit, or lie down on your side. 2. Write down the date, the start time and stop time, and the number of movements that you felt between those two times. Take this information with you to your health care visits. 3. Write down your start time when you feel the first movement. 4. Count kicks, flutters, swishes, rolls, and jabs. You should feel at least 10 movements. 5. You may stop counting after you have felt 10 movements, or if you have been counting for 2 hours. Write down the stop time. 6. If you do not feel 10 movements in 2 hours, contact your health care provider for further instructions. Your health care provider may want to do additional tests to assess your baby's well-being. Contact a health care provider if:  You feel fewer than 10 movements in 2 hours.  Your baby is not moving like he or she usually does. Date: ____________ Start time: ____________ Stop time: ____________ Movements: ____________ Date: ____________ Start time: ____________ Stop time: ____________ Movements: ____________ Date: ____________  Start time: ____________ Stop time: ____________ Movements: ____________ Date: ____________ Start time: ____________ Stop time: ____________ Movements: ____________ Date: ____________ Start time: ____________ Stop time: ____________ Movements: ____________ Date: ____________ Start time: ____________ Stop time: ____________ Movements: ____________ Date: ____________ Start time: ____________ Stop time: ____________ Movements: ____________ Date: ____________ Start time: ____________ Stop time: ____________ Movements: ____________ Date: ____________ Start time: ____________ Stop time: ____________ Movements: ____________ This information is not intended to replace advice given to you by your health care provider. Make sure you discuss any questions you have with your health care provider. Document Revised: 08/15/2018 Document Reviewed: 08/15/2018 Elsevier Patient Education  2020 [employer]  

## 2019-03-28 ENCOUNTER — Telehealth (HOSPITAL_COMMUNITY): Payer: Self-pay | Admitting: *Deleted

## 2019-03-28 ENCOUNTER — Encounter (HOSPITAL_COMMUNITY): Payer: Self-pay | Admitting: *Deleted

## 2019-03-28 ENCOUNTER — Other Ambulatory Visit (HOSPITAL_COMMUNITY)
Admission: RE | Admit: 2019-03-28 | Discharge: 2019-03-28 | Disposition: A | Discharge status: Home / Self Care | Payer: BC Managed Care – PPO | Source: Ambulatory Visit | Attending: Obstetrics | Admitting: Obstetrics

## 2019-03-28 DIAGNOSIS — Z3A36 36 weeks gestation of pregnancy: Secondary | ICD-10-CM | POA: Diagnosis not present

## 2019-03-28 DIAGNOSIS — O321XX0 Maternal care for breech presentation, not applicable or unspecified: Secondary | ICD-10-CM | POA: Diagnosis not present

## 2019-03-28 DIAGNOSIS — Z01812 Encounter for preprocedural laboratory examination: Secondary | ICD-10-CM | POA: Diagnosis not present

## 2019-03-28 DIAGNOSIS — Z20822 Contact with and (suspected) exposure to covid-19: Secondary | ICD-10-CM | POA: Insufficient documentation

## 2019-03-28 LAB — SARS CORONAVIRUS 2 (TAT 6-24 HRS): SARS Coronavirus 2: NEGATIVE

## 2019-03-28 NOTE — Telephone Encounter (Signed)
Preadmission screen  

## 2019-03-29 ENCOUNTER — Observation Stay (HOSPITAL_COMMUNITY): Payer: BC Managed Care – PPO

## 2019-03-29 ENCOUNTER — Encounter (HOSPITAL_COMMUNITY): Payer: Self-pay | Admitting: Obstetrics

## 2019-03-29 ENCOUNTER — Observation Stay (HOSPITAL_COMMUNITY)
Admission: AD | Admit: 2019-03-29 | Discharge: 2019-03-29 | Disposition: A | Discharge status: Home / Self Care | Payer: BC Managed Care – PPO | Attending: Obstetrics | Admitting: Obstetrics

## 2019-03-29 ENCOUNTER — Other Ambulatory Visit: Payer: Self-pay

## 2019-03-29 DIAGNOSIS — O321XX0 Maternal care for breech presentation, not applicable or unspecified: Secondary | ICD-10-CM | POA: Diagnosis not present

## 2019-03-29 DIAGNOSIS — O3433 Maternal care for cervical incompetence, third trimester: Secondary | ICD-10-CM | POA: Insufficient documentation

## 2019-03-29 DIAGNOSIS — O09892 Supervision of other high risk pregnancies, second trimester: Secondary | ICD-10-CM | POA: Diagnosis not present

## 2019-03-29 DIAGNOSIS — Z3A36 36 weeks gestation of pregnancy: Secondary | ICD-10-CM | POA: Insufficient documentation

## 2019-03-29 LAB — CBC
HCT: 41.1 % (ref 36.0–46.0)
Hemoglobin: 14.3 g/dL (ref 12.0–15.0)
MCH: 31.8 pg (ref 26.0–34.0)
MCHC: 34.8 g/dL (ref 30.0–36.0)
MCV: 91.5 fL (ref 80.0–100.0)
Platelets: 166 10*3/uL (ref 150–400)
RBC: 4.49 MIL/uL (ref 3.87–5.11)
RDW: 13 % (ref 11.5–15.5)
WBC: 7.5 10*3/uL (ref 4.0–10.5)
nRBC: 0 % (ref 0.0–0.2)

## 2019-03-29 LAB — TYPE AND SCREEN
ABO/RH(D): O POS
Antibody Screen: NEGATIVE

## 2019-03-29 MED ORDER — TERBUTALINE SULFATE 1 MG/ML IJ SOLN
INTRAMUSCULAR | Status: AC
  Filled 2019-03-29: qty 1

## 2019-03-29 MED ORDER — LACTATED RINGERS IV SOLN: INTRAVENOUS | Status: DC

## 2019-03-29 MED ORDER — TERBUTALINE SULFATE 1 MG/ML IJ SOLN: 0.2500 mg | Freq: Once | INTRAMUSCULAR | Status: DC

## 2019-03-29 NOTE — Discharge Instructions (Signed)

## 2019-03-29 NOTE — H&P (Signed)
Jackie [name] is a 29 y.o. G3P1011 at [redacted]w[redacted]d presenting for attempted ECV. Pt notes mild contractions. Good fetal movement, No vaginal bleeding, not leaking fluid. Pt presented with decresed FM yesterday, reactive NST but u/s showed breech presentation, AFI 18, ant placenta.   [facility] at [facility] since first trimester. - h/o early cervical dilation. Started at 26 wks. Did BMZ and 24 hr indocin. Modified bed rest since. No tocolytics, Cvx 3/50 at 36 wks. H/o term but precipitous delivery - DS 134, 20# wt gain in preg, AGA baby   Prenatal Transfer Tool  Maternal Diabetes: No Genetic Screening: Normal Maternal Ultrasounds/Referrals: Normal Fetal Ultrasounds or other Referrals:  None Maternal Substance Abuse:  No Significant Maternal Medications:  None Significant Maternal Lab Results: Other:      OB History    Gravida  3   Para  1   Term  1   Preterm      AB  1   Living  1     SAB  1   TAB      Ectopic      Multiple      Live Births  1          Past Medical History:  Diagnosis Date  . Chondromalacia of right patella 10/2013  . Compartment syndrome, nontraumatic, lower extremity 10/2013   right  . GERD (gastroesophageal reflux disease)   . Hypothyroidism   . Migraines   . Retroversion, uterus    Past Surgical History:  Procedure Laterality Date  . FASCIOTOMY Right 10/30/2013   Procedure: RELEASE RIGHT ANTERIOR COMPARTMENT FASCIOTOMY ;  Surgeon: Ninetta Lights, MD;  Location: [place];  Service: Orthopedics;  Laterality: Right;  . KNEE ARTHROSCOPY W/ PLICA EXCISION Left 05/12/6501  . KNEE ARTHROSCOPY WITH FULKERSON SLIDE Right 10/30/2013   Procedure: RIGHT KNEE SCOPE LATERAL RELEASE/CHONDROPLASTY ANTERIOR TIBIAL TUBERCLEPLASTY;  Surgeon: Ninetta Lights, MD;  Location: [facility];  Service: Orthopedics;  Laterality: Right;  ANESTHESIA:  GENERAL, PRE/POST OP FEMORAL NERVE  . KNEE ARTHROSCOPY WITH LATERAL RELEASE Left  07/24/2013   Procedure: LEFT ARTHROSCOPY KNEE WITH LATERAL RELEASE,WITH DEBRIDEMENT/SHAVING (CHONDROPLASTY) ANTERIOR TIBIAL TUBERCLEPLASTY;  Surgeon: Ninetta Lights, MD;  Location: [place];  Service: Orthopedics;  Laterality: Left;  . KNEE SURGERY Bilateral    Family History: family history includes Diabetes in her paternal grandmother; Migraines in her father; Thyroid disease in her mother. Social History:  reports that she has never smoked. She has never used smokeless tobacco. She reports that she does not drink alcohol or use drugs.  Review of Systems - Negative except Discomfort of pregnancy     Blood pressure 126/87, pulse 75, temperature 98.2 F (36.8 C), temperature source Oral, resp. rate 18, height 5\' 5"  (1.651 m), weight 86.6 kg, SpO2 100 %, unknown if currently breastfeeding.  Physical Exam:  Gen: well appearing, no distress  Back: no CVAT Abd: gravid, NT, no RUQ pain, fundus nontender.  Baby is vertex felt in the epigastric area LE: Trace edema, equal bilaterally, non-tender Toco: None FH: baseline 135's, accelerations present, no deceleratons, 10 beat variability Cervix: 3 to 4 cm, stretchy, vertex -2 and ballotable, 50% effaced, posterior position  Prenatal labs: ABO, Rh: --/--/O POS, O POS Performed at [facility] [address] [place], [place] [address]  [phone]/12 1806) Antibody: NEG (01/12 1806) Rubella: Immune (08/03 0000) RPR: Nonreactive (08/03 0000)  HBsAg: Negative (08/03 0000)  HIV: Non-reactive (08/03 0000)  GBS:   pending 1 hr Glucola 134  Genetic screening nl NT, nl AFP Anatomy US normal  Real-time ultrasound: Complete breech, adequate AFI.  Placenta anterior and to the right, spine to maternal right.  Version attempt: After type and screen resulted, IV in place, and 1 hour of reactive nonstress test, consent again reviewed and patient agrees to proceed.  The infant sacrum was elevated out of the pelvis and slow pressure was  placed on the vertex in an attempt for forward roll infant toward maternal left.  3 successive attempts were made with about 1 minute rest in between.  During rest time ultrasound was done to watch fetal heart which remained in the 120s range.  No significant movement of fetal position.  Decision made to attempt a backward roll.  Given the placental location care was taken.  Again the sacrum was elevated out of the pelvis and this time pressure of the vertex toward maternal right.  2 attempts were done but again unable to flip baby to vertex position.  1 hour of an NST was done postprocedure.  Toco with no contractions.  FH, 125's, positive accelerations, no decelerations, 10 beat variability.  Assessment/Plan: 29 y.o. G3P1011 at [redacted]w[redacted]d Unsuccessful attempt at external cephalic version.  Baby remains in the breech position.  Patient was instructed on warning signs.  She should plan fetal kick counts daily.  She should report to the hospital immediately with signs of labor, leaking fluid, vaginal bleeding, decreased fetal movement.  Patient was instructed on spinning babies website and will attempt home maneuvers.  Chiropractory also suggested.  We will set up C-section for 39 weeks with plan for Wednesday, April 7, in the afternoon.  Follow-up in the office in 1 week.  [name] 03/29/2019 10:57 AM     [name] 03/29/2019, 8:30 AM

## 2019-03-31 ENCOUNTER — Other Ambulatory Visit: Payer: Self-pay | Admitting: Obstetrics

## 2019-03-31 ENCOUNTER — Inpatient Hospital Stay (HOSPITAL_COMMUNITY)
Admission: AD | Admit: 2019-03-31 | Discharge: 2019-03-31 | Disposition: A | Discharge status: Home / Self Care | Payer: BC Managed Care – PPO | Attending: Obstetrics and Gynecology | Admitting: Obstetrics and Gynecology

## 2019-03-31 ENCOUNTER — Other Ambulatory Visit: Payer: Self-pay

## 2019-03-31 ENCOUNTER — Encounter (HOSPITAL_COMMUNITY): Payer: Self-pay | Admitting: Obstetrics and Gynecology

## 2019-03-31 DIAGNOSIS — Z3A36 36 weeks gestation of pregnancy: Secondary | ICD-10-CM | POA: Diagnosis not present

## 2019-03-31 DIAGNOSIS — Z3A37 37 weeks gestation of pregnancy: Secondary | ICD-10-CM | POA: Insufficient documentation

## 2019-03-31 DIAGNOSIS — O479 False labor, unspecified: Secondary | ICD-10-CM

## 2019-03-31 DIAGNOSIS — O321XX0 Maternal care for breech presentation, not applicable or unspecified: Secondary | ICD-10-CM | POA: Insufficient documentation

## 2019-03-31 DIAGNOSIS — O4703 False labor before 37 completed weeks of gestation, third trimester: Secondary | ICD-10-CM | POA: Diagnosis not present

## 2019-03-31 LAB — URINALYSIS, ROUTINE W REFLEX MICROSCOPIC
Bilirubin Urine: NEGATIVE
Glucose, UA: NEGATIVE mg/dL
Hgb urine dipstick: NEGATIVE
Ketones, ur: NEGATIVE mg/dL
Leukocytes,Ua: NEGATIVE
Nitrite: NEGATIVE
Protein, ur: NEGATIVE mg/dL
Specific Gravity, Urine: 1.009 (ref 1.005–1.030)
pH: 7 (ref 5.0–8.0)

## 2019-03-31 MED ORDER — LACTATED RINGERS IV BOLUS
500.0000 mL | Freq: Once | INTRAVENOUS | Status: AC
  Administered 2019-03-31: 500 mL via INTRAVENOUS

## 2019-03-31 MED ORDER — BETAMETHASONE SOD PHOS & ACET 6 (3-3) MG/ML IJ SUSP
12.0000 mg | Freq: Once | INTRAMUSCULAR | Status: AC
  Administered 2019-03-31: 12 mg via INTRAMUSCULAR
  Filled 2019-03-31: qty 5

## 2019-03-31 MED ORDER — TERBUTALINE SULFATE 1 MG/ML IJ SOLN
0.2500 mg | Freq: Once | INTRAMUSCULAR | Status: AC
  Administered 2019-03-31: 0.25 mg via SUBCUTANEOUS
  Filled 2019-03-31: qty 1

## 2019-03-31 NOTE — MAU Note (Signed)
Pt states she began having UCs at approx 0500 & states they have been persistent since then; rates as 4/10 on pain scale.  Denies LOF or VB.  States on Sat SVE was 3cm.  States unsure of FM.

## 2019-03-31 NOTE — Progress Notes (Addendum)
Case discussed with RN Preterm breech IUP s/p failed ECV Had inc contractions this am . Good FM , no bleeding or LOF BP 133/89 (BP Location: Right Arm)   Pulse 82   Temp 98.2 F (36.8 C) (Oral)   Resp 18   SpO2 98%   Category 1 tracing Irregular mild contractions VE unchanged per RN (3-4 cm) Will hydrate  And give [place] terb x one due to preterm status. Observe for Labor .  Fu at 1020 Irregular contractions. Pt comfortable BP 133/89 (BP Location: Right Arm)   Pulse 82   Temp 98.2 F (36.8 C) (Oral)   Resp 18   SpO2 98%   VE 3+/50/-3 No change noted Recommend dc home vs further monitoring vs 24 hr obs Pt considering options at this time

## 2019-04-02 NOTE — Patient Instructions (Signed)
Jackie [name]  04/02/2019   Your procedure is scheduled on:  04/16/2019  Arrive at 1245 at Entrance C on [employer] at [facility]  and [employer]. You are invited to use the FREE valet parking or use the Visitor's parking deck.  Pick up the phone at the desk and dial [phone].  Call this number if you have problems the morning of surgery: [phone]  Remember:   Do not eat food:(After Midnight) Desps de medianoche.  Do not drink clear liquids: (After Midnight) Desps de medianoche.  Take these medicines the morning of surgery with A SIP OF WATER:  none   Do not wear jewelry, make-up or nail polish.  Do not wear lotions, powders, or perfumes. Do not wear deodorant.  Do not shave 48 hours prior to surgery.  Do not bring valuables to the hospital.  The [facility] is not   responsible for any belongings or valuables brought to the hospital.  Contacts, dentures or bridgework may not be worn into surgery.  Leave suitcase in the car. After surgery it may be brought to your room.  For patients admitted to the hospital, checkout time is 11:00 AM the day of              discharge.      Please read over the following fact sheets that you were given:     Preparing for Surgery

## 2019-04-03 ENCOUNTER — Other Ambulatory Visit: Payer: Self-pay

## 2019-04-03 ENCOUNTER — Encounter (HOSPITAL_COMMUNITY): Payer: Self-pay | Admitting: Obstetrics

## 2019-04-03 ENCOUNTER — Inpatient Hospital Stay (HOSPITAL_COMMUNITY): Payer: BC Managed Care – PPO | Admitting: Anesthesiology

## 2019-04-03 ENCOUNTER — Inpatient Hospital Stay (HOSPITAL_COMMUNITY)
Admission: AD | Admit: 2019-04-03 | Discharge: 2019-04-05 | DRG: 788 | Disposition: A | Discharge status: Home / Self Care | Payer: BC Managed Care – PPO | Attending: Obstetrics & Gynecology | Admitting: Obstetrics & Gynecology

## 2019-04-03 ENCOUNTER — Encounter (HOSPITAL_COMMUNITY)
Admission: AD | Disposition: A | Discharge status: Home / Self Care | Payer: Self-pay | Source: Home / Self Care | Attending: Obstetrics & Gynecology

## 2019-04-03 DIAGNOSIS — O99892 Other specified diseases and conditions complicating childbirth: Secondary | ICD-10-CM | POA: Diagnosis present

## 2019-04-03 DIAGNOSIS — Z3A37 37 weeks gestation of pregnancy: Secondary | ICD-10-CM | POA: Diagnosis not present

## 2019-04-03 DIAGNOSIS — O321XX0 Maternal care for breech presentation, not applicable or unspecified: Secondary | ICD-10-CM | POA: Diagnosis present

## 2019-04-03 DIAGNOSIS — Z412 Encounter for routine and ritual male circumcision: Secondary | ICD-10-CM | POA: Diagnosis not present

## 2019-04-03 DIAGNOSIS — Z23 Encounter for immunization: Secondary | ICD-10-CM | POA: Diagnosis not present

## 2019-04-03 DIAGNOSIS — O4703 False labor before 37 completed weeks of gestation, third trimester: Secondary | ICD-10-CM | POA: Diagnosis not present

## 2019-04-03 LAB — CBC
HCT: 43.1 % (ref 36.0–46.0)
Hemoglobin: 14.6 g/dL (ref 12.0–15.0)
MCH: 31.4 pg (ref 26.0–34.0)
MCHC: 33.9 g/dL (ref 30.0–36.0)
MCV: 92.7 fL (ref 80.0–100.0)
Platelets: 181 10*3/uL (ref 150–400)
RBC: 4.65 MIL/uL (ref 3.87–5.11)
RDW: 12.8 % (ref 11.5–15.5)
WBC: 12.5 10*3/uL — ABNORMAL HIGH (ref 4.0–10.5)
nRBC: 0 % (ref 0.0–0.2)

## 2019-04-03 LAB — TYPE AND SCREEN
ABO/RH(D): O POS
Antibody Screen: NEGATIVE

## 2019-04-03 SURGERY — Surgical Case
Anesthesia: Spinal | Site: Abdomen | Wound class: Clean Contaminated

## 2019-04-03 MED ORDER — KETOROLAC TROMETHAMINE 30 MG/ML IJ SOLN: 30.0000 mg | Freq: Four times a day (QID) | INTRAMUSCULAR | Status: AC | PRN

## 2019-04-03 MED ORDER — TETANUS-DIPHTH-ACELL PERTUSSIS 5-2.5-18.5 LF-MCG/0.5 IM SUSP: 0.5000 mL | Freq: Once | INTRAMUSCULAR | Status: DC

## 2019-04-03 MED ORDER — KETOROLAC TROMETHAMINE 30 MG/ML IJ SOLN
30.0000 mg | Freq: Four times a day (QID) | INTRAMUSCULAR | Status: AC
  Administered 2019-04-03 – 2019-04-04 (×3): 30 mg via INTRAVENOUS
  Filled 2019-04-03 (×3): qty 1

## 2019-04-03 MED ORDER — ACETAMINOPHEN 160 MG/5ML PO SOLN: 1000.0000 mg | Freq: Once | ORAL | Status: DC

## 2019-04-03 MED ORDER — SIMETHICONE 80 MG PO CHEW
80.0000 mg | CHEWABLE_TABLET | ORAL | Status: DC
  Administered 2019-04-03 – 2019-04-04 (×3): 80 mg via ORAL
  Filled 2019-04-03 (×2): qty 1

## 2019-04-03 MED ORDER — STERILE WATER FOR IRRIGATION IR SOLN
Status: DC | PRN
  Administered 2019-04-03: 1000 mL

## 2019-04-03 MED ORDER — LACTATED RINGERS IV SOLN: INTRAVENOUS | Status: DC

## 2019-04-03 MED ORDER — DIBUCAINE (PERIANAL) 1 % EX OINT: 1.0000 "application " | TOPICAL_OINTMENT | CUTANEOUS | Status: DC | PRN

## 2019-04-03 MED ORDER — DIPHENHYDRAMINE HCL 25 MG PO CAPS: 25.0000 mg | ORAL_CAPSULE | Freq: Four times a day (QID) | ORAL | Status: DC | PRN

## 2019-04-03 MED ORDER — COCONUT OIL OIL
1.0000 "application " | TOPICAL_OIL | Status: DC | PRN
  Administered 2019-04-05: 1 via TOPICAL

## 2019-04-03 MED ORDER — DEXAMETHASONE SODIUM PHOSPHATE 10 MG/ML IJ SOLN
INTRAMUSCULAR | Status: DC | PRN
  Administered 2019-04-03: 10 mg via INTRAVENOUS

## 2019-04-03 MED ORDER — OXYTOCIN 40 UNITS IN NORMAL SALINE INFUSION - SIMPLE MED: 2.5000 [IU]/h | INTRAVENOUS | Status: AC

## 2019-04-03 MED ORDER — PHENYLEPHRINE HCL-NACL 20-0.9 MG/250ML-% IV SOLN
INTRAVENOUS | Status: DC | PRN
  Administered 2019-04-03: 60 ug/min via INTRAVENOUS

## 2019-04-03 MED ORDER — KETOROLAC TROMETHAMINE 30 MG/ML IJ SOLN: 30.0000 mg | Freq: Once | INTRAMUSCULAR | Status: DC

## 2019-04-03 MED ORDER — IBUPROFEN 800 MG PO TABS
800.0000 mg | ORAL_TABLET | Freq: Four times a day (QID) | ORAL | Status: DC
  Administered 2019-04-04 – 2019-04-05 (×5): 800 mg via ORAL
  Filled 2019-04-03 (×5): qty 1

## 2019-04-03 MED ORDER — ACETAMINOPHEN 500 MG PO TABS
1000.0000 mg | ORAL_TABLET | Freq: Four times a day (QID) | ORAL | Status: AC
  Administered 2019-04-03 – 2019-04-04 (×3): 1000 mg via ORAL
  Filled 2019-04-03 (×3): qty 2

## 2019-04-03 MED ORDER — CEFAZOLIN SODIUM-DEXTROSE 2-4 GM/100ML-% IV SOLN
2.0000 g | INTRAVENOUS | Status: AC
  Administered 2019-04-03: 2 g via INTRAVENOUS
  Filled 2019-04-03: qty 100

## 2019-04-03 MED ORDER — FAMOTIDINE IN NACL 20-0.9 MG/50ML-% IV SOLN
20.0000 mg | Freq: Once | INTRAVENOUS | Status: AC
  Administered 2019-04-03: 20 mg via INTRAVENOUS
  Filled 2019-04-03: qty 50

## 2019-04-03 MED ORDER — DIPHENHYDRAMINE HCL 25 MG PO CAPS: 25.0000 mg | ORAL_CAPSULE | ORAL | Status: DC | PRN

## 2019-04-03 MED ORDER — OXYTOCIN 40 UNITS IN NORMAL SALINE INFUSION - SIMPLE MED
INTRAVENOUS | Status: AC
  Filled 2019-04-03: qty 1000

## 2019-04-03 MED ORDER — ACETAMINOPHEN 325 MG PO TABS
650.0000 mg | ORAL_TABLET | ORAL | Status: DC | PRN
  Administered 2019-04-04: 650 mg via ORAL
  Filled 2019-04-03: qty 2

## 2019-04-03 MED ORDER — MENTHOL 3 MG MT LOZG: 1.0000 | LOZENGE | OROMUCOSAL | Status: DC | PRN

## 2019-04-03 MED ORDER — FENTANYL CITRATE (PF) 100 MCG/2ML IJ SOLN
INTRAMUSCULAR | Status: DC | PRN
  Administered 2019-04-03: 15 ug via INTRATHECAL

## 2019-04-03 MED ORDER — SIMETHICONE 80 MG PO CHEW: 80.0000 mg | CHEWABLE_TABLET | ORAL | Status: DC | PRN

## 2019-04-03 MED ORDER — PANTOPRAZOLE SODIUM 40 MG PO TBEC
40.0000 mg | DELAYED_RELEASE_TABLET | Freq: Every day | ORAL | Status: DC
  Administered 2019-04-04 – 2019-04-05 (×2): 40 mg via ORAL
  Filled 2019-04-03 (×2): qty 1

## 2019-04-03 MED ORDER — NALBUPHINE HCL 10 MG/ML IJ SOLN: 5.0000 mg | INTRAMUSCULAR | Status: DC | PRN

## 2019-04-03 MED ORDER — ACETAMINOPHEN 500 MG PO TABS: 1000.0000 mg | ORAL_TABLET | Freq: Once | ORAL | Status: DC

## 2019-04-03 MED ORDER — NALOXONE HCL 4 MG/10ML IJ SOLN
1.0000 ug/kg/h | INTRAVENOUS | Status: DC | PRN
  Filled 2019-04-03: qty 5

## 2019-04-03 MED ORDER — NALBUPHINE HCL 10 MG/ML IJ SOLN: 5.0000 mg | Freq: Once | INTRAMUSCULAR | Status: DC | PRN

## 2019-04-03 MED ORDER — PRENATAL MULTIVITAMIN CH
1.0000 | ORAL_TABLET | Freq: Every day | ORAL | Status: DC
  Administered 2019-04-04 – 2019-04-05 (×2): 1 via ORAL
  Filled 2019-04-03 (×2): qty 1

## 2019-04-03 MED ORDER — ZOLPIDEM TARTRATE 5 MG PO TABS: 5.0000 mg | ORAL_TABLET | Freq: Every evening | ORAL | Status: DC | PRN

## 2019-04-03 MED ORDER — SODIUM CHLORIDE 0.9 % IV SOLN: INTRAVENOUS | Status: DC | PRN

## 2019-04-03 MED ORDER — BUPIVACAINE IN DEXTROSE 0.75-8.25 % IT SOLN
INTRATHECAL | Status: DC | PRN
  Administered 2019-04-03: 1.6 mg via INTRATHECAL

## 2019-04-03 MED ORDER — KETOROLAC TROMETHAMINE 30 MG/ML IJ SOLN
INTRAMUSCULAR | Status: AC
  Filled 2019-04-03: qty 1

## 2019-04-03 MED ORDER — SOD CITRATE-CITRIC ACID 500-334 MG/5ML PO SOLN
30.0000 mL | Freq: Once | ORAL | Status: AC
  Administered 2019-04-03: 30 mL via ORAL
  Filled 2019-04-03: qty 30

## 2019-04-03 MED ORDER — SODIUM CHLORIDE 0.9 % IR SOLN
Status: DC | PRN
  Administered 2019-04-03: 1000 mL

## 2019-04-03 MED ORDER — KETOROLAC TROMETHAMINE 30 MG/ML IJ SOLN
30.0000 mg | Freq: Four times a day (QID) | INTRAMUSCULAR | Status: AC | PRN
  Administered 2019-04-03: 30 mg via INTRAMUSCULAR

## 2019-04-03 MED ORDER — ONDANSETRON HCL 4 MG/2ML IJ SOLN: 4.0000 mg | Freq: Three times a day (TID) | INTRAMUSCULAR | Status: DC | PRN

## 2019-04-03 MED ORDER — DEXAMETHASONE SODIUM PHOSPHATE 10 MG/ML IJ SOLN
INTRAMUSCULAR | Status: AC
  Filled 2019-04-03: qty 1

## 2019-04-03 MED ORDER — FENTANYL CITRATE (PF) 100 MCG/2ML IJ SOLN
INTRAMUSCULAR | Status: AC
  Filled 2019-04-03: qty 2

## 2019-04-03 MED ORDER — OXYTOCIN 40 UNITS IN NORMAL SALINE INFUSION - SIMPLE MED
INTRAVENOUS | Status: DC | PRN
  Administered 2019-04-03: 40 [IU] via INTRAVENOUS

## 2019-04-03 MED ORDER — OXYCODONE HCL 5 MG PO TABS: 5.0000 mg | ORAL_TABLET | ORAL | Status: DC | PRN

## 2019-04-03 MED ORDER — FENTANYL CITRATE (PF) 100 MCG/2ML IJ SOLN: 25.0000 ug | INTRAMUSCULAR | Status: DC | PRN

## 2019-04-03 MED ORDER — SENNOSIDES-DOCUSATE SODIUM 8.6-50 MG PO TABS
2.0000 | ORAL_TABLET | ORAL | Status: DC
  Administered 2019-04-04 (×2): 2 via ORAL
  Filled 2019-04-03 (×2): qty 2

## 2019-04-03 MED ORDER — ONDANSETRON HCL 4 MG/2ML IJ SOLN
INTRAMUSCULAR | Status: AC
  Filled 2019-04-03: qty 2

## 2019-04-03 MED ORDER — PROMETHAZINE HCL 25 MG/ML IJ SOLN: 6.2500 mg | INTRAMUSCULAR | Status: DC | PRN

## 2019-04-03 MED ORDER — WITCH HAZEL-GLYCERIN EX PADS: 1.0000 "application " | MEDICATED_PAD | CUTANEOUS | Status: DC | PRN

## 2019-04-03 MED ORDER — MORPHINE SULFATE (PF) 0.5 MG/ML IJ SOLN
INTRAMUSCULAR | Status: DC | PRN
  Administered 2019-04-03: .15 mg via INTRATHECAL

## 2019-04-03 MED ORDER — PHENYLEPHRINE HCL-NACL 20-0.9 MG/250ML-% IV SOLN
INTRAVENOUS | Status: AC
  Filled 2019-04-03: qty 250

## 2019-04-03 MED ORDER — SODIUM CHLORIDE 0.9% FLUSH: 3.0000 mL | INTRAVENOUS | Status: DC | PRN

## 2019-04-03 MED ORDER — DIPHENHYDRAMINE HCL 50 MG/ML IJ SOLN
12.5000 mg | INTRAMUSCULAR | Status: DC | PRN
  Administered 2019-04-03: 12.5 mg via INTRAVENOUS
  Filled 2019-04-03: qty 1

## 2019-04-03 MED ORDER — SIMETHICONE 80 MG PO CHEW
80.0000 mg | CHEWABLE_TABLET | Freq: Three times a day (TID) | ORAL | Status: DC
  Administered 2019-04-04 – 2019-04-05 (×3): 80 mg via ORAL
  Filled 2019-04-03 (×4): qty 1

## 2019-04-03 MED ORDER — CEFAZOLIN SODIUM-DEXTROSE 2-4 GM/100ML-% IV SOLN
INTRAVENOUS | Status: AC
  Filled 2019-04-03: qty 100

## 2019-04-03 MED ORDER — NALOXONE HCL 0.4 MG/ML IJ SOLN: 0.4000 mg | INTRAMUSCULAR | Status: DC | PRN

## 2019-04-03 MED ORDER — MORPHINE SULFATE (PF) 0.5 MG/ML IJ SOLN
INTRAMUSCULAR | Status: AC
  Filled 2019-04-03: qty 10

## 2019-04-03 MED ORDER — ONDANSETRON HCL 4 MG/2ML IJ SOLN
INTRAMUSCULAR | Status: DC | PRN
  Administered 2019-04-03: 4 mg via INTRAVENOUS

## 2019-04-03 SURGICAL SUPPLY — 37 items
APL SKNCLS STERI-STRIP NONHPOA (GAUZE/BANDAGES/DRESSINGS) ×2
BENZOIN TINCTURE PRP APPL 2/3 (GAUZE/BANDAGES/DRESSINGS) ×2 IMPLANT
CHLORAPREP W/TINT 26ML (MISCELLANEOUS) ×2 IMPLANT
CLAMP CORD UMBIL (MISCELLANEOUS) IMPLANT
CLOSURE STERI STRIP 1/2 X4 (GAUZE/BANDAGES/DRESSINGS) ×1 IMPLANT
CLOTH BEACON ORANGE TIMEOUT ST (SAFETY) ×2 IMPLANT
DRSG OPSITE POSTOP 4X10 (GAUZE/BANDAGES/DRESSINGS) ×2 IMPLANT
ELECT REM PT RETURN 9FT ADLT (ELECTROSURGICAL) ×2
ELECTRODE REM PT RTRN 9FT ADLT (ELECTROSURGICAL) ×1 IMPLANT
EXTRACTOR VACUUM KIWI (MISCELLANEOUS) IMPLANT
EXTRACTOR VACUUM M CUP 4 TUBE (SUCTIONS) IMPLANT
GLOVE BIO SURGEON STRL SZ7 (GLOVE) ×2 IMPLANT
GLOVE BIOGEL PI IND STRL 7.0 (GLOVE) ×2 IMPLANT
GLOVE BIOGEL PI INDICATOR 7.0 (GLOVE) ×2
GOWN STRL REUS W/TWL LRG LVL3 (GOWN DISPOSABLE) ×4 IMPLANT
KIT ABG SYR 3ML LUER SLIP (SYRINGE) IMPLANT
NDL HYPO 25X5/8 SAFETYGLIDE (NEEDLE) IMPLANT
NEEDLE HYPO 25X5/8 SAFETYGLIDE (NEEDLE) IMPLANT
NS IRRIG 1000ML POUR BTL (IV SOLUTION) ×2 IMPLANT
PACK C SECTION WH (CUSTOM PROCEDURE TRAY) ×2 IMPLANT
PAD OB MATERNITY 4.3X12.25 (PERSONAL CARE ITEMS) ×2 IMPLANT
RTRCTR C-SECT PINK 25CM LRG (MISCELLANEOUS) IMPLANT
STRIP CLOSURE SKIN 1/2X4 (GAUZE/BANDAGES/DRESSINGS) ×1 IMPLANT
SUT MNCRL 0 VIOLET CTX 36 (SUTURE) ×2 IMPLANT
SUT MONOCRYL 0 CTX 36 (SUTURE) ×4
SUT PLAIN 0 NONE (SUTURE) IMPLANT
SUT PLAIN 2 0 (SUTURE) ×2
SUT PLAIN ABS 2-0 CT1 27XMFL (SUTURE) IMPLANT
SUT VIC AB 0 CT1 27 (SUTURE) ×4
SUT VIC AB 0 CT1 27XBRD ANBCTR (SUTURE) ×2 IMPLANT
SUT VIC AB 2-0 CT1 27 (SUTURE) ×2
SUT VIC AB 2-0 CT1 TAPERPNT 27 (SUTURE) ×1 IMPLANT
SUT VIC AB 4-0 KS 27 (SUTURE) ×2 IMPLANT
SUT VICRYL 0 TIES 12 18 (SUTURE) IMPLANT
TOWEL OR 17X24 6PK STRL BLUE (TOWEL DISPOSABLE) ×2 IMPLANT
TRAY FOLEY W/BAG SLVR 14FR LF (SET/KITS/TRAYS/PACK) IMPLANT
WATER STERILE IRR 1000ML POUR (IV SOLUTION) ×2 IMPLANT

## 2019-04-03 NOTE — Anesthesia Postprocedure Evaluation (Signed)
Anesthesia Post Note  Patient: Jackie [name]  Procedure(s) Performed: CESAREAN SECTION (N/A Abdomen)     Patient location during evaluation: PACU Anesthesia Type: Spinal Level of consciousness: awake and alert and oriented Pain management: pain level controlled Vital Signs Assessment: post-procedure vital signs reviewed and stable Respiratory status: spontaneous breathing, nonlabored ventilation and respiratory function stable Cardiovascular status: blood pressure returned to baseline Postop Assessment: no apparent nausea or vomiting, spinal receding, no headache and no backache Anesthetic complications: no    Last Vitals:  Vitals:   04/03/19 1403 04/03/19 1510  BP: 125/75 112/72  Pulse: (!) 52   Resp: 18 18  Temp: 36.5 C 36.6 C  SpO2: 98% 99%    Last Pain:  Vitals:   04/03/19 1510  TempSrc: Oral  PainSc:    Pain Goal:                   Kaylyn Layer

## 2019-04-03 NOTE — MAU Note (Signed)
.   Jackie [name] is a 29 y.o. at [redacted]w[redacted]d here in MAU reporting: she was evaluated in office today and was 4-5 cms. Pt has breech presentation and had a 3 hour labor with her first baby 2 years ago. Reports bloody show  Onset of complaint: ongoing Pain score: 5 Vitals:   04/03/19 1050 04/03/19 1052  BP:  132/88  Pulse:  82  Resp:  18  Temp:  98.4 F (36.9 C)  SpO2: 99% 99%     FHT:135 Lab orders placed from triage: UA

## 2019-04-03 NOTE — Transfer of Care (Signed)
Immediate Anesthesia Transfer of Care Note  Patient: Jackie [name]  Procedure(s) Performed: CESAREAN SECTION (N/A Abdomen)  Patient Location: PACU  Anesthesia Type:Spinal  Level of Consciousness: awake  Airway & Oxygen Therapy: Patient Spontanous Breathing  Post-op Assessment: Report given to RN  Post vital signs: Reviewed and stable  Last Vitals:  Vitals Value Taken Time  BP 118/68 04/03/19 1255  Temp    Pulse 67 04/03/19 1256  Resp 18 04/03/19 1256  SpO2 97 % 04/03/19 1256  Vitals shown include unvalidated device data.  Last Pain:  Vitals:   04/03/19 1050  PainSc: 5          Complications: No apparent anesthesia complications

## 2019-04-03 NOTE — Anesthesia Preprocedure Evaluation (Addendum)
Anesthesia Evaluation  Patient identified by MRN, date of birth, ID band Patient awake    Reviewed: Allergy & Precautions, NPO status , Patient's Chart, lab work & pertinent test results  History of Anesthesia Complications Negative for: history of anesthetic complications  Airway Mallampati: II  TM Distance: >3 FB Neck ROM: Full    Dental no notable dental hx.    Pulmonary neg pulmonary ROS,    Pulmonary exam normal        Cardiovascular negative cardio ROS Normal cardiovascular exam     Neuro/Psych  Headaches, negative psych ROS   GI/Hepatic Neg liver ROS, GERD  Controlled,  Endo/Other  Hypothyroidism   Renal/GU negative Renal ROS  negative genitourinary   Musculoskeletal  (+) Arthritis ,   Abdominal   Peds  Hematology negative hematology ROS (+)   Anesthesia Other Findings Day of surgery medications reviewed with patient.  Reproductive/Obstetrics (+) Pregnancy (breech)                            Anesthesia Physical Anesthesia Plan  ASA: III and emergent  Anesthesia Plan: Spinal   Post-op Pain Management:    Induction:   PONV Risk Score and Plan: 4 or greater and Treatment may vary due to age or medical condition, Ondansetron and Dexamethasone  Airway Management Planned: Natural Airway  Additional Equipment: None  Intra-op Plan:   Post-operative Plan:   Informed Consent: I have reviewed the patients History and Physical, chart, labs and discussed the procedure including the risks, benefits and alternatives for the proposed anesthesia with the patient or authorized representative who has indicated his/her understanding and acceptance.       Plan Discussed with:   Anesthesia Plan Comments: (Urgent C/S for breech in active labor. )       Anesthesia Quick Evaluation

## 2019-04-03 NOTE — Op Note (Signed)
Cesarean Section Procedure Note   Jackie [name]  04/03/2019  Indications: G2P1001, failed external cephalic version last week. Active labor, Breech. 37.1 wks.   Pre-operative Diagnosis: breech, 37 weeks, active labor .   Post-operative Diagnosis: Same   Surgeon: Shea Evans, MD   Assistants: Carlean Jews, CNM  Anesthesia: spinal   Procedure Details:  The patient was seen in the Triage Room. The risks, benefits, complications, treatment options, and expected outcomes were discussed with the patient. The patient concurred with the proposed plan, giving informed consent. identified as [name] and the procedure verified as C-Section Delivery. A Time Out was held and the above information confirmed. 2 gm Ancef given.  After induction of anesthesia, the patient was draped and prepped in the usual sterile manner, foley was draining urine well.  A pfannenstiel incision was made and carried down through the subcutaneous tissue to the fascia. Fascial incision was made and extended transversely. The fascia was separated from the underlying rectus tissue superiorly and inferiorly. The peritoneum was identified and entered. Peritoneal incision was extended longitudinally. Alexis-O retractor placed. The utero-vesical peritoneal reflection was incised transversely and the bladder flap was bluntly freed from the lower uterine segment. A low transverse uterine incision was made. Delivered from Complete breech presentation by breech extraction steps is a FEMALE infant with vigorous cry at 12.16 PM on 04/03/2019. Apgar scores of 8 at one minute and 9 at five minutes. Delayed cord clamping done at 1 minute and baby handed to NICU team in attendance. Cord ph was sent. Cord blood was obtained for evaluation. The placenta was removed Intact and appeared normal. The uterine outline, tubes and ovaries appeared normal. The uterine incision was closed with running locked sutures of . A second imbricating  layer sutured.   Hemostasis was observed. Alexis retractor removed. Peritoneal closure done with 2-0 Vicryl.  The fascia was then reapproximated with running sutures of 0Vicryl. The subcuticular closure was performed using 2-0plain gut. The skin was closed with 4-0Vicryl.   Instrument, sponge, and needle counts were correct prior the abdominal closure and were correct at the conclusion of the case.   Findings: Female infant delivered from Complete Breech presentation by breech extraction, born at 12.16 PM. Apgars 8 and 9. Nl placenta, cord. Nl tubes and ovaries and uterus.    Estimated Blood Loss: 350 cc   Total IV Fluids: 1300 ml LR   Urine Output: 125CC OF clear urine  Specimens: cord blood  Complications: no complications  Disposition: PACU - hemodynamically stable.   Maternal Condition: stable   Baby condition / location:  Couplet care / Skin to Skin  Attending Attestation: I performed the procedure.   Signed: Surgeon(s): Shea Evans, MD

## 2019-04-03 NOTE — Lactation Note (Signed)
This note was copied from a baby's chart. Lactation Consultation Note  Patient Name: Jackie [name] Date: 04/03/2019 Reason for consult: Initial assessment;Early term 37-38.6wks;Infant < 6lbs  Visited with mom of a 6 hours old ETI < 6 lbs. She's a P2 and experienced BF, she BF her first child for 12 months and didn't report any BF difficulties. She's familiar with hand expression and already able to get droplets of colostrum when when [facility] assisted with hand expression. . She has 4 DEBPs at home, a Medela, Spectra (both from her first baby), a Motif and a Elvie (these two are brand new).  Baby's first glucose reading was at 38 mg/dl and mom told LC he just finished nursing for 45 minutes. Second glucose reading came back at 52 after [facility] consultation. Offered mom to set up with a DEBP and she agreed to start pumping tonight, instructions, cleaning and storage were reviewed, mom started pumping during [facility] consultation. Per mom BF is going well and baby is already latching on, praised her for her efforts. He fell asleep after last nursing session, mom doing STS with baby when entering the room.  Parents aware that due to baby's weight and G.A most likely need supplementation, mom's goal is exclusivity, so donor milk was offered in addition to mother's milk to complete volumes required per baby's age in hours. Reviewed LPI policy, normal newborn behavior, cluster feeding, feeding cues, newborn hypoglecemia and supplementation guidelines for LPI's.  Feeding plan:  1. Encouraged mom to keep feeding baby STS 8-12 times/24 hours or sooner if feeding cues are present 2. Mom will pump after feeding and will offer baby any amount of EBM she may get 3. She'll let her RN know when baby is ready to start donor milk, most likely tonight, and will be given every 3 hours after mother's milk has been used. 4. She'll limit feedings at the breast to no more than 30 minutes at a time  BF brochure, BF resources,  feeding diary and LPI handout were reviewed. Dad present and supportive. Parents reported all questions and concerns were answered, they're both aware of LC OP services and will call PRN.   Maternal Data Formula Feeding for Exclusion: No Has patient been taught Hand Expression?: Yes Does the patient have breastfeeding experience prior to this delivery?: Yes  Feeding Feeding Type: Breast Fed  LATCH Score                   Interventions Interventions: Breast feeding basics reviewed;Hand express;DEBP;Breast compression;Breast massage;Skin to skin  Lactation Tools Discussed/Used Tools: Pump Breast pump type: Double-Electric Breast Pump WIC Program: No Pump Review: Setup, frequency, and cleaning Initiated by:: MPeck Date initiated:: 04/03/19   Consult Status Consult Status: Follow-up Date: 04/04/19 Follow-up type: In-patient    Selig Wampole Venetia Constable 04/03/2019, 6:44 PM

## 2019-04-03 NOTE — Anesthesia Procedure Notes (Signed)
Spinal  Patient location during procedure: OR Start time: 04/03/2019 11:50 AM End time: 04/03/2019 11:53 AM Staffing Performed: anesthesiologist  Anesthesiologist: Kaylyn Layer, MD Preanesthetic Checklist Completed: patient identified, IV checked, risks and benefits discussed, monitors and equipment checked, pre-op evaluation and timeout performed Spinal Block Patient position: sitting Prep: DuraPrep and site prepped and draped Patient monitoring: heart rate, continuous pulse ox and blood pressure Approach: midline Location: L3-4 Injection technique: single-shot Needle Needle type: Pencan  Needle gauge: 24 G Needle length: 10 cm Assessment Sensory level: T4 Additional Notes Risks, benefits, and alternative discussed. Patient gave consent to procedure. Prepped and draped in sitting position. Clear CSF obtained after one needle pass. Positive terminal aspiration. No pain or paraesthesias with injection. Patient tolerated procedure well. Vital signs stable. Amalia Greenhouse, MD

## 2019-04-03 NOTE — H&P (Addendum)
Jackie [name] is a 29 y.o. G3P1011 at 37+1 weeks presenting for labor with breech presentation.  She was 4-5cm in office this morning and now 5-6cm.    [facility] at [facility] since first trimester. - h/o early cervical dilation. Started at 26 wks. Did BMZ and 24 hr indocin. Modified bed rest since. No tocolytics, Cvx 3/50 at 36 wks. H/o term but precipitous delivery - DS 134, 20# wt gain in preg, AGA baby - Failed ECV for breech presentation on 03/29/2019   Prenatal Transfer Tool  Maternal Diabetes: No Genetic Screening: Normal Maternal Ultrasounds/Referrals: Normal Fetal Ultrasounds or other Referrals:  None Maternal Substance Abuse:  No Significant Maternal Medications:  None Significant Maternal Lab Results: Other:      OB History    Gravida  3   Para  1   Term  1   Preterm      AB  1   Living  1     SAB  1   TAB      Ectopic      Multiple      Live Births  1          Past Medical History:  Diagnosis Date  . Chondromalacia of right patella 10/2013  . Compartment syndrome, nontraumatic, lower extremity 10/2013   right  . GERD (gastroesophageal reflux disease)   . Hypothyroidism   . Migraines   . Retroversion, uterus    Past Surgical History:  Procedure Laterality Date  . FASCIOTOMY Right 10/30/2013   Procedure: RELEASE RIGHT ANTERIOR COMPARTMENT FASCIOTOMY ;  Surgeon: Loreta Ave, MD;  Location: [facility];  Service: Orthopedics;  Laterality: Right;  . KNEE ARTHROSCOPY W/ PLICA EXCISION Left 07/12/2010  . KNEE ARTHROSCOPY WITH FULKERSON SLIDE Right 10/30/2013   Procedure: RIGHT KNEE SCOPE LATERAL RELEASE/CHONDROPLASTY ANTERIOR TIBIAL TUBERCLEPLASTY;  Surgeon: Loreta Ave, MD;  Location: [facility];  Service: Orthopedics;  Laterality: Right;  ANESTHESIA:  GENERAL, PRE/POST OP FEMORAL NERVE  . KNEE ARTHROSCOPY WITH LATERAL RELEASE Left 07/24/2013   Procedure: LEFT ARTHROSCOPY KNEE WITH LATERAL RELEASE,WITH  DEBRIDEMENT/SHAVING (CHONDROPLASTY) ANTERIOR TIBIAL TUBERCLEPLASTY;  Surgeon: Loreta Ave, MD;  Location: [facility];  Service: Orthopedics;  Laterality: Left;  . KNEE SURGERY Bilateral    Family History: family history includes Diabetes in her paternal grandmother; Migraines in her father; Thyroid disease in her mother. Social History:  reports that she has never smoked. She has never used smokeless tobacco. She reports that she does not drink alcohol or use drugs.  Review of Systems - Negative except contractions, bloody show Genito-Urinary ROS: no dysuria, trouble voiding, or hematuria     Today's Vitals   04/03/19 1050 04/03/19 1052 04/03/19 1112  BP:  132/88 130/85  Pulse:  82 79  Resp:  18   Temp:  98.4 F (36.9 C)   SpO2: 99% 99%   Weight:  85.7 kg   PainSc: 5      Physical Exam:  Gen: well appearing, no distress  Back: no CVAT Abd: gravid, NT, no RUQ pain, fundus nontender.  Baby is vertex felt in the epigastric area LE: Trace edema, equal bilaterally, non-tender Toco: None FH: baseline 130, accelerations present, no deceleratons, 10 beat variability Cervix   5-6cm/breech  Prenatal labs: ABO, Rh: --/--/O POS (03/20 1601) Antibody: NEG (03/20 0932) Rubella: Immune (08/03 0000) RPR: Nonreactive (08/03 0000)  HBsAg: Negative (08/03 0000)  HIV: Non-reactive (08/03 0000)  GBS:   negative 1  hr Glucola 134  Genetic screening nl NT, nl AFP Anatomy US normal   Assessment/Plan: 29 y.o. G3P1011 at 37+1 weeks, Active labor, Breech presentation GBS negative Proceed with primary cesarean section. Consented by Dr. Benjie Karvonen in MAU. Ancef 2grams  Pt seen and assessed by Dr Benjie Karvonen.  Risks/complications of surgery reviewed incl infection, bleeding, damage to internal organs including bladder, bowels, ureters, blood vessels, other risks from anesthesia, VTE and delayed complications of any surgery, complications in future surgery reviewed. Also discussed neonatal  complications incl difficult delivery, laceration, vacuum assistance, TTN etc. Pt understands and agrees, all concerns addressed.    --V.Conlan Miceli <MD

## 2019-04-04 LAB — CBC
HCT: 36.1 % (ref 36.0–46.0)
Hemoglobin: 12.6 g/dL (ref 12.0–15.0)
MCH: 31.6 pg (ref 26.0–34.0)
MCHC: 34.9 g/dL (ref 30.0–36.0)
MCV: 90.5 fL (ref 80.0–100.0)
Platelets: 160 10*3/uL (ref 150–400)
RBC: 3.99 MIL/uL (ref 3.87–5.11)
RDW: 12.9 % (ref 11.5–15.5)
WBC: 14 10*3/uL — ABNORMAL HIGH (ref 4.0–10.5)
nRBC: 0 % (ref 0.0–0.2)

## 2019-04-04 LAB — RPR: RPR Ser Ql: NONREACTIVE

## 2019-04-04 NOTE — Lactation Note (Signed)
This note was copied from a baby's chart. Lactation Consultation Note  Patient Name: Boy Lera Gaines FEXMD'Y Date: 04/04/2019  Parents finishing lunch on arrival.  Baby boy Boschert now 38 hours old.  Circumsized this am.  Baby boy Furia is an ETI and less than six pounds.  Mom reports doing some pumping past breastfeeding, but only gets a drop.  Mom reports adds massage and hand expression and gets 4-5 drops.  Praised breastfeeding, pumping, and hand expression.  Urged mom to keep doing what she's doing.  Rev cluster feeding.  Discussed how may not be fesible to do much pumping this pm if infant starts to cluster feed.  Praised breastfeeding.  Urged to call lactat on as needed. Rev post circ breastfeeding behavior.     Maternal Data    Feeding    LATCH Score                   Interventions    Lactation Tools Discussed/Used     Consult Status      Kolbey Teichert Michaelle Copas 04/04/2019, 1:51 PM

## 2019-04-04 NOTE — Progress Notes (Signed)
POD# 1  S: Pt notes pain controlled w/ po meds, minimal lochia, nl void, out of bed w/o dizziness or chest pain, tol reg po, + flatus. Pt is  breastfeeding  Vitals:   04/03/19 1510 04/03/19 1930 04/04/19 0030 04/04/19 0430  BP: 112/72 114/75 (!) 100/53 (!) 96/55  Pulse:  (!) 57 76 66  Resp: 18 16 18 16   Temp: 97.8 F (36.6 C) 99.1 F (37.3 C) 98.9 F (37.2 C) 98.6 F (37 C)  TempSrc: Oral Oral Oral Oral  SpO2: 99% 98% 99%   Weight:        Gen: well appearing CV: RRR Pulm: CTAB Abd: soft, ND, approp tender, fundus below umbilicus, NT Inc: C/D/I, LE: tr edema, NT  CBC    Component Value Date/Time   WBC 14.0 (H) 04/04/2019 0521   RBC 3.99 04/04/2019 0521   HGB 12.6 04/04/2019 0521   HGB 15.0 05/08/2014 0842   HCT 36.1 04/04/2019 0521   HCT 44.4 05/08/2014 0842   PLT 160 04/04/2019 0521   PLT 246 05/08/2014 0842   MCV 90.5 04/04/2019 0521   MCV 90 05/08/2014 0842   MCH 31.6 04/04/2019 0521   MCHC 34.9 04/04/2019 0521   RDW 12.9 04/04/2019 0521   RDW 12.9 05/08/2014 0842   LYMPHSABS 1.5 05/08/2014 0842   MONOABS 0.6 12/21/2009 1440   EOSABS 0.0 05/08/2014 0842   BASOSABS 0.0 05/08/2014 0842    A/P: POD#  1 s/p PCS for breech in labor - post-op. Doing well. - consent for circumcision - likely d/c home tomorrow   05/10/2014 04/04/2019 4:42 PM  1

## 2019-04-05 NOTE — Discharge Summary (Signed)
Obstetric Discharge Summary Reason for Admission: onset of labor Prenatal Procedures: none Intrapartum Procedures: cesarean: low cervical, transverse Postpartum Procedures: none Complications-Operative and Postpartum: none Hemoglobin  Date Value Ref Range Status  04/04/2019 12.6 12.0 - 15.0 g/dL Final  69/86/1483 07.3 11.1 - 15.9 g/dL Final   HCT  Date Value Ref Range Status  04/04/2019 36.1 36.0 - 46.0 % Final   Hematocrit  Date Value Ref Range Status  05/08/2014 44.4 34.0 - 46.6 % Final    Physical Exam:  General: alert, cooperative and appears stated age 29: appropriate Uterine Fundus: firm Incision: healing well DVT Evaluation: No evidence of DVT seen on physical exam.  Discharge Diagnoses: Term Pregnancy-delivered  Discharge Information: Date: 04/05/2019 Activity: pelvic rest Diet: routine Medications: PNV and Ibuprofen Condition: stable Instructions: refer to practice specific booklet Discharge to: home   Newborn Data: Live born female  Birth Weight: 5 lb 7.7 oz (2485 g) APGAR: 9, 9  Newborn Delivery   Birth date/time: 04/03/2019 12:16:00 Delivery type: C-Section, Low Transverse Trial of labor: No C-section categorization: Primary      Home with mother.  Yidel Teuscher J 04/05/2019, 2:55 PM

## 2019-04-05 NOTE — Lactation Note (Signed)
This note was copied from a baby's chart. Lactation Consultation Note  Patient Name: Jackie [name] Date: 04/05/2019 Reason for consult: Follow-up assessment;Early term 37-38.6wks;Infant < 6lbs;Infant weight loss;Other (Comment)(8 % weight loss/ P 2) Baby is 25 hours old  And has been D/C by the Ped[facility].  F/U appt in the am for weight check.  Per mom baby is latching well with swallows.  Mom showed the LC volume of milk and it was about 10 ml .  LC recommended to continue post pumping after feedings so the baby can be  Supplemented with at least 30 ml with medium based nipple newborn - Dr. Owens Shark.  Feed the baby at  The beast for 15 -20 mins , and then supplement with 30 ml .  Post pump for 10 -15 mins , save milk for the next feeding.  Next feeding switch breast and do the same STS .  Once the weight loss decreases to at least 6 % , can have some feedings where The baby is just breast feeding and post pump.  Per mom  nipples are sore LC offered to assess and mom receptive , right nipple noted to have a small positional strip and the left small bruise on the areola.  No breakdown and the nipples clear. Per mom felt like it was due to having on the pump on to high.  LC provided comfort gels x 6 days , alternating with shells when awake.  Use EBM liberally and coconut oil when using the shells until the soreness is healed.  Milk easily expressed and areolas compressible for a deep latch.  Baby last fed at 7:30 am.  Per mom feels good about the latching.  Sore nipple and engorgement prevention and tx reviewed , storage of breast milk.  LC recommended and offered to request the [facility] O/P clinic call mom for [facility] O/P and mom requested to be able to call back. Mom has the [facility] pamphlet with phone numbers.  Mom has the DEBP kit with hand pump , 2 DEBP s and a HAKKA at home.  Mom receptive to teaching and review.   Maternal Data Has patient been taught Hand Expression?: Yes  Feeding Feeding  Type: Breast Milk  LATCH Score                   Interventions Interventions: Breast feeding basics reviewed;Coconut oil;Shells;Comfort gels;Hand pump;DEBP  Lactation Tools Discussed/Used Tools: Shells;Pump;Coconut oil;Comfort gels Shell Type: Inverted Breast pump type: Manual;Double-Electric Breast Pump Pump Review: Milk Storage   Consult Status Consult Status: Follow-up(LC offered to request and LC O/P appt and mom requested to call back for appt) Date: ([place] highly recommended to consider to have LC O./P appt in 5-7 days due to weight loss , ET and > 6 pounds) Follow-up type: [place] 04/05/2019, 10:38 AM

## 2019-04-05 NOTE — Progress Notes (Signed)
POD# 2  S: Pt notes pain controlled w/ po meds, minimal lochia, nl void, out of bed w/o dizziness or chest pain, tol reg po, + flatus. Pt is  breastfeeding  Vitals:   04/04/19 0030 04/04/19 0430 04/04/19 2143 04/05/19 0516  BP: (!) 100/53 (!) 96/55 116/69 125/75  Pulse: 76 66 92 65  Resp: 18 16 18 18   Temp: 98.9 F (37.2 C) 98.6 F (37 C) 98.2 F (36.8 C) 98.2 F (36.8 C)  TempSrc: Oral Oral Oral Oral  SpO2: 99%   98%  Weight:        Gen: well appearing CV: RRR Pulm: CTAB Abd: soft, ND, approp tender, fundus below umbilicus, NT Inc: C/D/I, LE: tr edema, NT  CBC    Component Value Date/Time   WBC 14.0 (H) 04/04/2019 0521   RBC 3.99 04/04/2019 0521   HGB 12.6 04/04/2019 0521   HGB 15.0 05/08/2014 0842   HCT 36.1 04/04/2019 0521   HCT 44.4 05/08/2014 0842   PLT 160 04/04/2019 0521   PLT 246 05/08/2014 0842   MCV 90.5 04/04/2019 0521   MCV 90 05/08/2014 0842   MCH 31.6 04/04/2019 0521   MCHC 34.9 04/04/2019 0521   RDW 12.9 04/04/2019 0521   RDW 12.9 05/08/2014 0842   LYMPHSABS 1.5 05/08/2014 0842   MONOABS 0.6 12/21/2009 1440   EOSABS 0.0 05/08/2014 0842   BASOSABS 0.0 05/08/2014 0842    A/P: POD#  2 s/p PCS for breech in labor - post-op. Doing well. - likely d/c home   Ludwika Rodd J 04/05/2019 11:17 AM  1

## 2019-04-14 ENCOUNTER — Other Ambulatory Visit (HOSPITAL_COMMUNITY)
Admission: RE | Admit: 2019-04-14 | Discharge: 2019-04-14 | Disposition: A | Discharge status: Home / Self Care | Payer: BC Managed Care – PPO | Source: Ambulatory Visit | Attending: Family Medicine | Admitting: Family Medicine

## 2019-04-16 ENCOUNTER — Encounter (HOSPITAL_COMMUNITY)
Admission: AD | Disposition: A | Discharge status: Home / Self Care | Payer: Self-pay | Source: Home / Self Care | Attending: Obstetrics and Gynecology

## 2019-04-16 SURGERY — Surgical Case
Anesthesia: Spinal

## 2019-04-30 DIAGNOSIS — O8600 Infection of obstetric surgical wound, unspecified: Secondary | ICD-10-CM | POA: Diagnosis not present

## 2019-05-08 DIAGNOSIS — O8600 Infection of obstetric surgical wound, unspecified: Secondary | ICD-10-CM | POA: Diagnosis not present

## 2019-05-16 DIAGNOSIS — Z7189 Other specified counseling: Secondary | ICD-10-CM | POA: Diagnosis not present

## 2019-05-29 DIAGNOSIS — Z30430 Encounter for insertion of intrauterine contraceptive device: Secondary | ICD-10-CM | POA: Diagnosis not present

## 2019-05-29 DIAGNOSIS — N760 Acute vaginitis: Secondary | ICD-10-CM | POA: Diagnosis not present

## 2019-05-29 DIAGNOSIS — Z118 Encounter for screening for other infectious and parasitic diseases: Secondary | ICD-10-CM | POA: Diagnosis not present

## 2019-05-29 DIAGNOSIS — Z3202 Encounter for pregnancy test, result negative: Secondary | ICD-10-CM | POA: Diagnosis not present

## 2019-09-28 ENCOUNTER — Telehealth: Payer: Self-pay | Admitting: Nurse Practitioner

## 2019-09-28 NOTE — Telephone Encounter (Signed)
I returned [name]'s call to the COVID 19 mAb infusion hotline in reference to her husband.  Prior to handing him the phone, she inquired about options for asymptomatic, unvaccinated individuals, who have been exposed to COVID.  I advised that if she'd like, I would forward her information to our subcutaneous mAb clinic coordinator.  [name] requested that I do so.  Patient information sent to Angus Seller, NP.  Nicolasa Ducking, NP 09/28/2019, 1:43 PM

## 2019-09-29 ENCOUNTER — Other Ambulatory Visit: Payer: Self-pay | Admitting: Nurse Practitioner

## 2019-09-29 ENCOUNTER — Telehealth: Payer: Self-pay | Admitting: Nurse Practitioner

## 2019-09-29 ENCOUNTER — Ambulatory Visit (HOSPITAL_COMMUNITY)
Admission: RE | Admit: 2019-09-29 | Discharge: 2019-09-29 | Disposition: A | Discharge status: Home / Self Care | Payer: BC Managed Care – PPO | Source: Ambulatory Visit | Attending: Pulmonary Disease | Admitting: Pulmonary Disease

## 2019-09-29 DIAGNOSIS — E663 Overweight: Secondary | ICD-10-CM | POA: Diagnosis not present

## 2019-09-29 DIAGNOSIS — U071 COVID-19: Secondary | ICD-10-CM

## 2019-09-29 MED ORDER — SODIUM CHLORIDE 0.9 % IV SOLN: INTRAVENOUS | Status: DC | PRN

## 2019-09-29 MED ORDER — EPINEPHRINE 0.3 MG/0.3ML IJ SOAJ: 0.3000 mg | Freq: Once | INTRAMUSCULAR | Status: DC | PRN

## 2019-09-29 MED ORDER — DIPHENHYDRAMINE HCL 50 MG/ML IJ SOLN: 50.0000 mg | Freq: Once | INTRAMUSCULAR | Status: DC | PRN

## 2019-09-29 MED ORDER — METHYLPREDNISOLONE SODIUM SUCC 125 MG IJ SOLR: 125.0000 mg | Freq: Once | INTRAMUSCULAR | Status: DC | PRN

## 2019-09-29 MED ORDER — ALBUTEROL SULFATE HFA 108 (90 BASE) MCG/ACT IN AERS: 2.0000 | INHALATION_SPRAY | Freq: Once | RESPIRATORY_TRACT | Status: DC | PRN

## 2019-09-29 MED ORDER — SODIUM CHLORIDE 0.9 % IV SOLN
1200.0000 mg | Freq: Once | INTRAVENOUS | Status: AC
  Administered 2019-09-29: 1200 mg via INTRAVENOUS

## 2019-09-29 MED ORDER — FAMOTIDINE IN NACL 20-0.9 MG/50ML-% IV SOLN: 20.0000 mg | Freq: Once | INTRAVENOUS | Status: DC | PRN

## 2019-09-29 NOTE — Discharge Instructions (Signed)

## 2019-09-29 NOTE — Progress Notes (Signed)
I connected by phone with Jackie [name] on 09/29/2019 at 8:44 AM to discuss the potential use of a new treatment for mild to moderate COVID-19 viral infection in non-hospitalized patients.  This patient is a 29 y.o. female that meets the FDA criteria for Emergency Use Authorization of COVID monoclonal antibody casirivimab/imdevimab.  Has a (+) direct SARS-CoV-2 viral test result  Has mild or moderate COVID-19   Is NOT hospitalized due to COVID-19  Is within 10 days of symptom onset  Has at least one of the high risk factor(s) for progression to severe COVID-19 and/or hospitalization as defined in EUA.  Specific high risk criteria : BMI > 25   I have spoken and communicated the following to the patient or parent/caregiver regarding COVID monoclonal antibody treatment:  1. FDA has authorized the emergency use for the treatment of mild to moderate COVID-19 in adults and pediatric patients with positive results of direct SARS-CoV-2 viral testing who are 38 years of age and older weighing at least 40 kg, and who are at high risk for progressing to severe COVID-19 and/or hospitalization.  2. The significant known and potential risks and benefits of COVID monoclonal antibody, and the extent to which such potential risks and benefits are unknown.  3. Information on available alternative treatments and the risks and benefits of those alternatives, including clinical trials.  4. Patients treated with COVID monoclonal antibody should continue to self-isolate and use infection control measures (e.g., wear mask, isolate, social distance, avoid sharing personal items, clean and disinfect "high touch" surfaces, and frequent handwashing) according to CDC guidelines.   5. The patient or parent/caregiver has the option to accept or refuse COVID monoclonal antibody treatment.  After reviewing this information with the patient, The patient agreed to proceed with receiving casirivimab\imdevimab infusion and  will be provided a copy of the Fact sheet prior to receiving the infusion. Ivonne Andrew 09/29/2019 8:44 AM

## 2019-09-29 NOTE — Telephone Encounter (Signed)
Called to Discuss with patient about Covid symptoms and the use of the SQ monoclonal antibody injection for those who have been exposed to Covid and at a high risk of hospitalization.     Pt appears to qualify for this injection due to co-morbid conditions and/or a member of an at-risk group in accordance with the FDA Emergency Use Authorization.    Unable to reach pt   

## 2019-09-29 NOTE — Progress Notes (Signed)
  Diagnosis: COVID-19  Physician: Dr. Delford Field  Procedure: Covid Infusion Clinic Med: casirivimab\imdevimab infusion - Provided patient with casirivimab\imdevimab fact sheet for patients, parents and caregivers prior to infusion.  Complications: No immediate complications noted.  Discharge: Discharged home   Jackie [name] 09/29/2019

## 2019-10-13 DIAGNOSIS — Z0000 Encounter for general adult medical examination without abnormal findings: Secondary | ICD-10-CM | POA: Diagnosis not present

## 2019-10-17 DIAGNOSIS — Z20822 Contact with and (suspected) exposure to covid-19: Secondary | ICD-10-CM | POA: Diagnosis not present

## 2019-10-24 DIAGNOSIS — Z03818 Encounter for observation for suspected exposure to other biological agents ruled out: Secondary | ICD-10-CM | POA: Diagnosis not present

## 2019-10-24 DIAGNOSIS — Z20822 Contact with and (suspected) exposure to covid-19: Secondary | ICD-10-CM | POA: Diagnosis not present

## 2019-11-02 DIAGNOSIS — Z7185 Encounter for immunization safety counseling: Secondary | ICD-10-CM | POA: Diagnosis not present

## 2019-11-02 DIAGNOSIS — R768 Other specified abnormal immunological findings in serum: Secondary | ICD-10-CM | POA: Diagnosis not present

## 2019-11-02 DIAGNOSIS — Z1152 Encounter for screening for COVID-19: Secondary | ICD-10-CM | POA: Diagnosis not present

## 2019-11-09 DIAGNOSIS — Z20822 Contact with and (suspected) exposure to covid-19: Secondary | ICD-10-CM | POA: Diagnosis not present

## 2019-11-16 DIAGNOSIS — Z20822 Contact with and (suspected) exposure to covid-19: Secondary | ICD-10-CM | POA: Diagnosis not present

## 2019-11-23 DIAGNOSIS — Z20822 Contact with and (suspected) exposure to covid-19: Secondary | ICD-10-CM | POA: Diagnosis not present

## 2019-11-30 DIAGNOSIS — Z7185 Encounter for immunization safety counseling: Secondary | ICD-10-CM | POA: Diagnosis not present

## 2019-11-30 DIAGNOSIS — Z1152 Encounter for screening for COVID-19: Secondary | ICD-10-CM | POA: Diagnosis not present

## 2019-11-30 DIAGNOSIS — Z20822 Contact with and (suspected) exposure to covid-19: Secondary | ICD-10-CM | POA: Diagnosis not present

## 2019-12-07 DIAGNOSIS — Z20822 Contact with and (suspected) exposure to covid-19: Secondary | ICD-10-CM | POA: Diagnosis not present

## 2019-12-14 DIAGNOSIS — Z9189 Other specified personal risk factors, not elsewhere classified: Secondary | ICD-10-CM | POA: Diagnosis not present

## 2019-12-14 DIAGNOSIS — Z20822 Contact with and (suspected) exposure to covid-19: Secondary | ICD-10-CM | POA: Diagnosis not present

## 2019-12-21 DIAGNOSIS — Z20822 Contact with and (suspected) exposure to covid-19: Secondary | ICD-10-CM | POA: Diagnosis not present

## 2019-12-28 DIAGNOSIS — Z20822 Contact with and (suspected) exposure to covid-19: Secondary | ICD-10-CM | POA: Diagnosis not present

## 2019-12-28 DIAGNOSIS — Z1152 Encounter for screening for COVID-19: Secondary | ICD-10-CM | POA: Diagnosis not present

## 2020-01-04 DIAGNOSIS — Z20822 Contact with and (suspected) exposure to covid-19: Secondary | ICD-10-CM | POA: Diagnosis not present

## 2020-01-11 DIAGNOSIS — Z20822 Contact with and (suspected) exposure to covid-19: Secondary | ICD-10-CM | POA: Diagnosis not present

## 2020-01-24 DIAGNOSIS — Z20822 Contact with and (suspected) exposure to covid-19: Secondary | ICD-10-CM | POA: Diagnosis not present

## 2020-01-24 DIAGNOSIS — Z1152 Encounter for screening for COVID-19: Secondary | ICD-10-CM | POA: Diagnosis not present

## 2020-02-01 DIAGNOSIS — Z03818 Encounter for observation for suspected exposure to other biological agents ruled out: Secondary | ICD-10-CM | POA: Diagnosis not present

## 2020-02-01 DIAGNOSIS — Z20822 Contact with and (suspected) exposure to covid-19: Secondary | ICD-10-CM | POA: Diagnosis not present

## 2020-03-26 DIAGNOSIS — M5459 Other low back pain: Secondary | ICD-10-CM | POA: Diagnosis not present

## 2020-03-26 DIAGNOSIS — M533 Sacrococcygeal disorders, not elsewhere classified: Secondary | ICD-10-CM | POA: Diagnosis not present

## 2020-04-17 DIAGNOSIS — M533 Sacrococcygeal disorders, not elsewhere classified: Secondary | ICD-10-CM | POA: Diagnosis not present

## 2020-04-17 DIAGNOSIS — M545 Low back pain, unspecified: Secondary | ICD-10-CM | POA: Diagnosis not present

## 2020-04-26 DIAGNOSIS — M545 Low back pain, unspecified: Secondary | ICD-10-CM | POA: Diagnosis not present

## 2020-04-26 DIAGNOSIS — M533 Sacrococcygeal disorders, not elsewhere classified: Secondary | ICD-10-CM | POA: Diagnosis not present

## 2020-05-05 DIAGNOSIS — M533 Sacrococcygeal disorders, not elsewhere classified: Secondary | ICD-10-CM | POA: Diagnosis not present

## 2020-05-19 DIAGNOSIS — M533 Sacrococcygeal disorders, not elsewhere classified: Secondary | ICD-10-CM | POA: Diagnosis not present

## 2020-06-08 DIAGNOSIS — M47816 Spondylosis without myelopathy or radiculopathy, lumbar region: Secondary | ICD-10-CM | POA: Diagnosis not present

## 2020-06-21 DIAGNOSIS — R102 Pelvic and perineal pain: Secondary | ICD-10-CM | POA: Diagnosis not present

## 2020-06-21 DIAGNOSIS — N941 Unspecified dyspareunia: Secondary | ICD-10-CM | POA: Diagnosis not present

## 2020-06-21 DIAGNOSIS — M545 Low back pain, unspecified: Secondary | ICD-10-CM | POA: Diagnosis not present

## 2020-06-21 DIAGNOSIS — M62838 Other muscle spasm: Secondary | ICD-10-CM | POA: Diagnosis not present

## 2020-07-06 DIAGNOSIS — M47817 Spondylosis without myelopathy or radiculopathy, lumbosacral region: Secondary | ICD-10-CM | POA: Diagnosis not present

## 2020-07-06 DIAGNOSIS — M47896 Other spondylosis, lumbar region: Secondary | ICD-10-CM | POA: Diagnosis not present

## 2020-07-06 DIAGNOSIS — M533 Sacrococcygeal disorders, not elsewhere classified: Secondary | ICD-10-CM | POA: Diagnosis not present

## 2020-07-10 DIAGNOSIS — N390 Urinary tract infection, site not specified: Secondary | ICD-10-CM | POA: Diagnosis not present

## 2020-09-27 IMAGING — US US MFM OB LIMITED
1 series · 15 of 22 positions shown · non-contrast
Comparison: none

[Series 1: us mfm ob limited · 22 acquisitions, 15 frames shown]
[im 1/22]
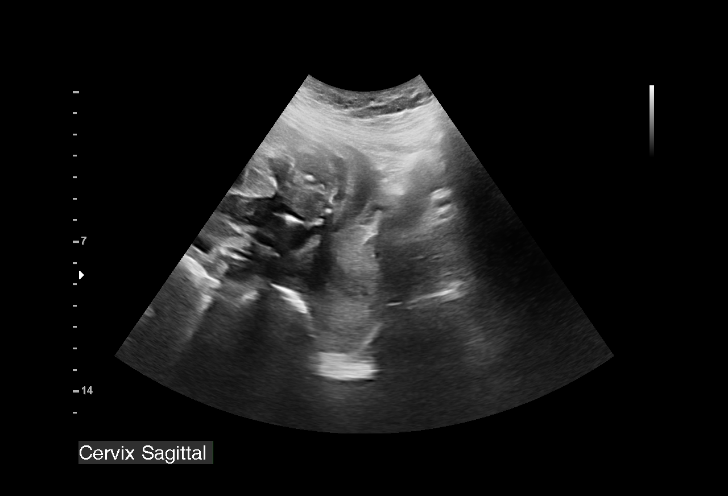
[im 3/22]
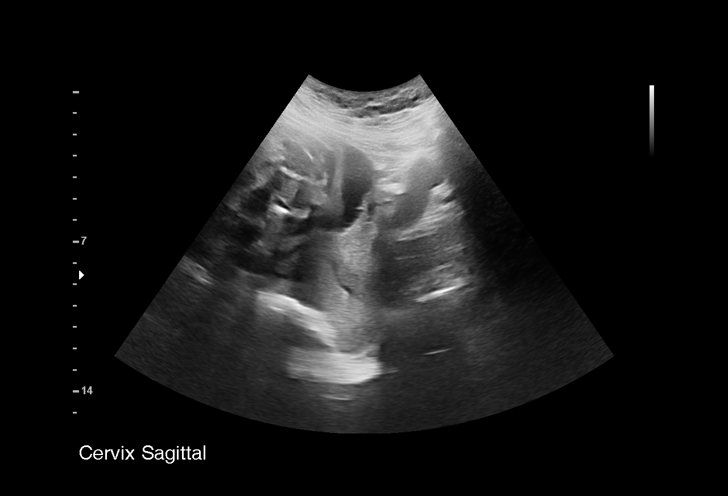
[im 4/22]
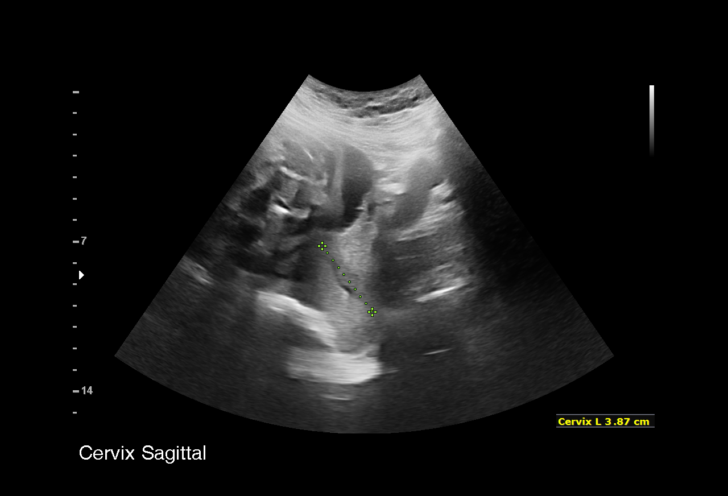
[im 6/22]
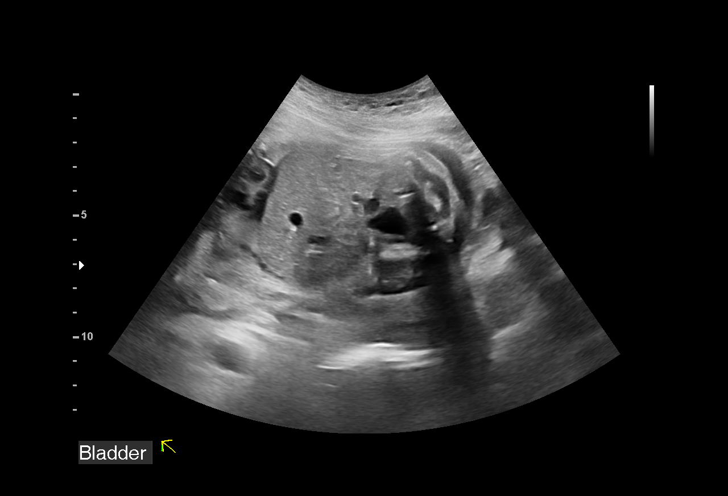
[im 7/22]
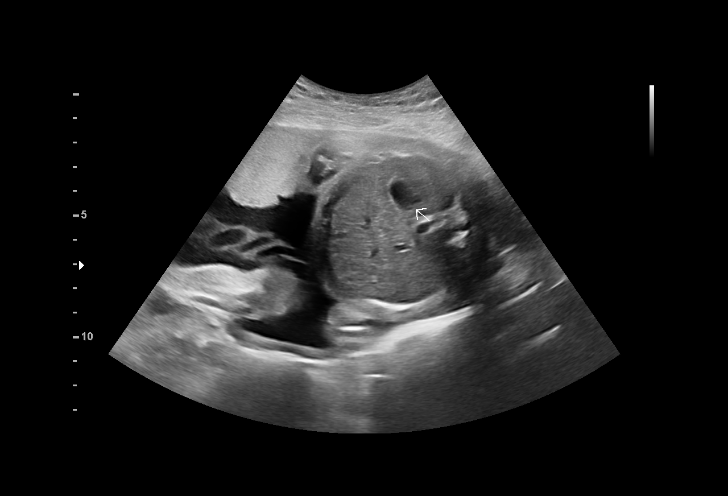
[im 9/22]
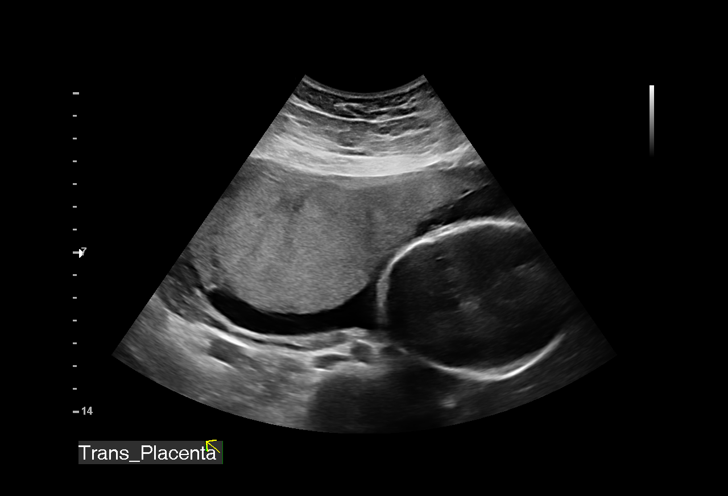
[im 10/22]
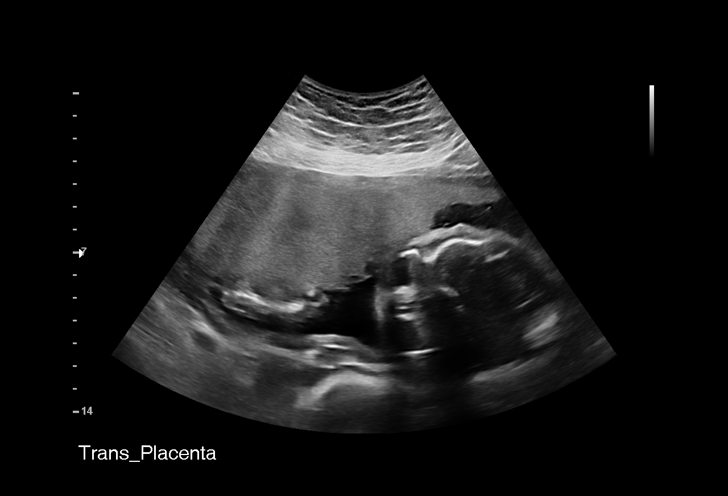
[im 12/22]
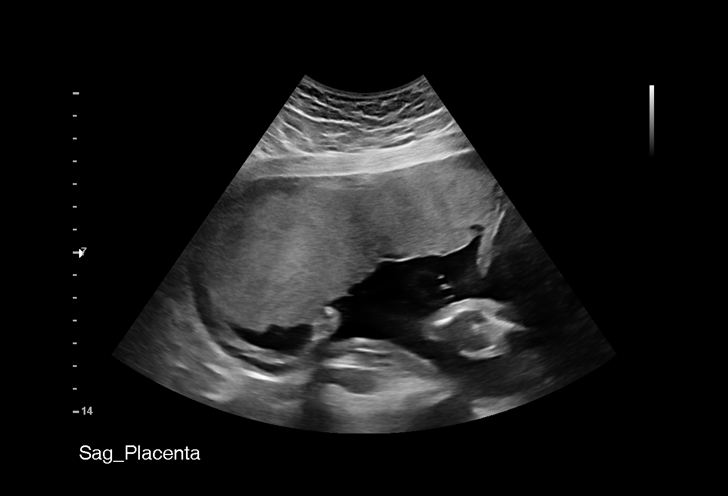
[im 13/22]
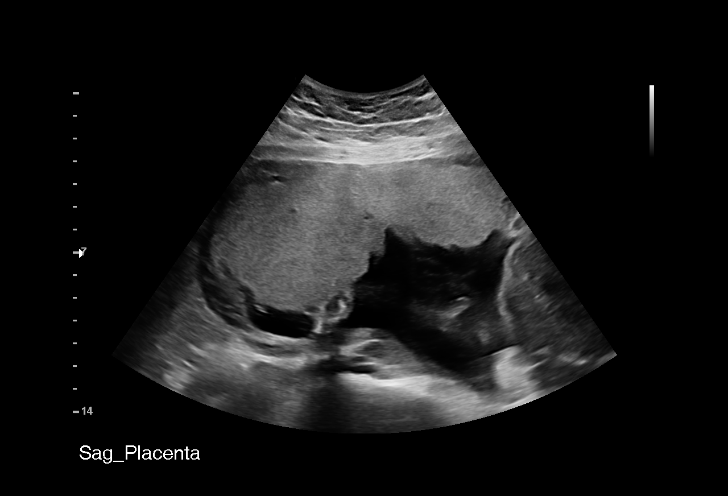
[im 14/22]
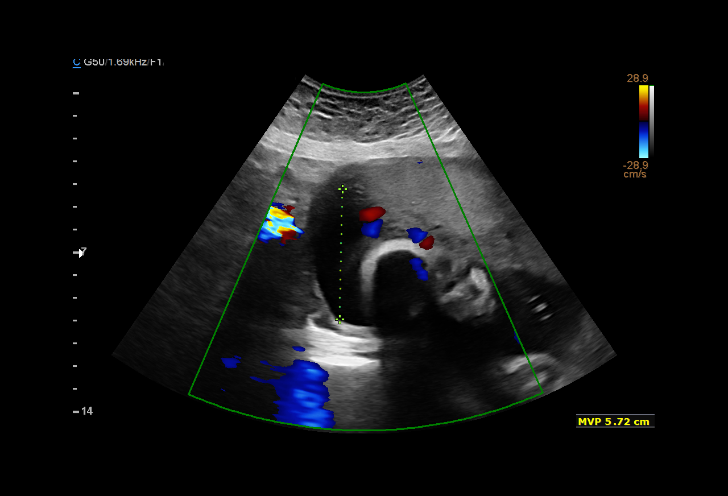
[im 16/22]
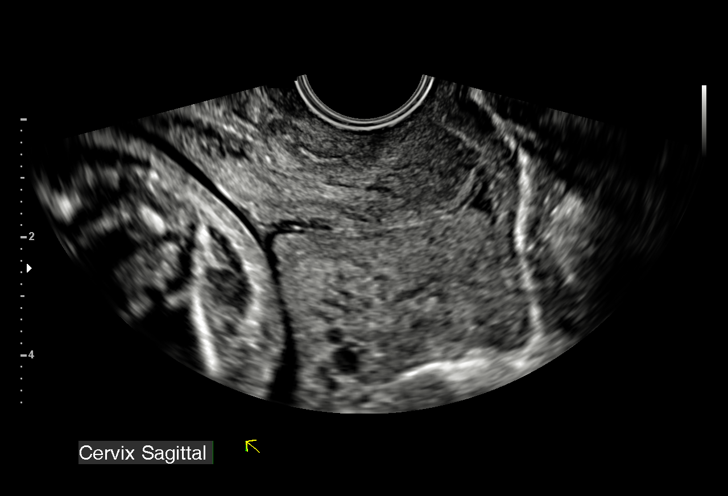
[im 17/22]
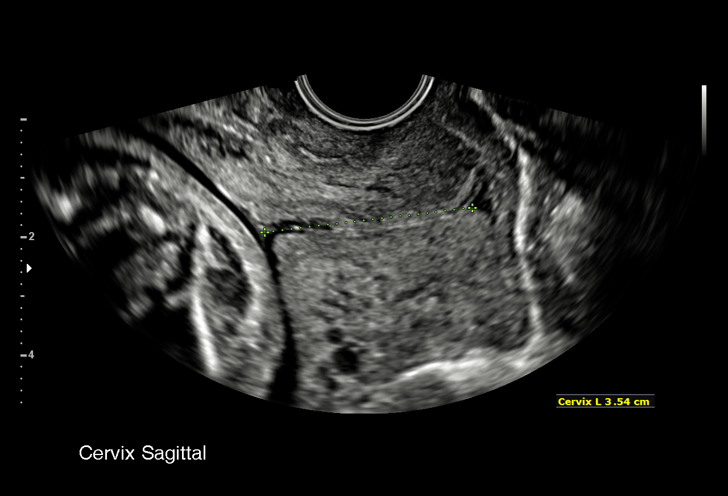
[im 19/22]
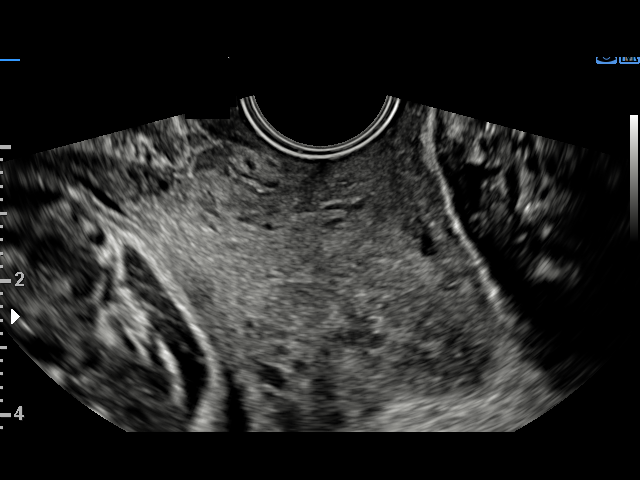
[im 20/22]
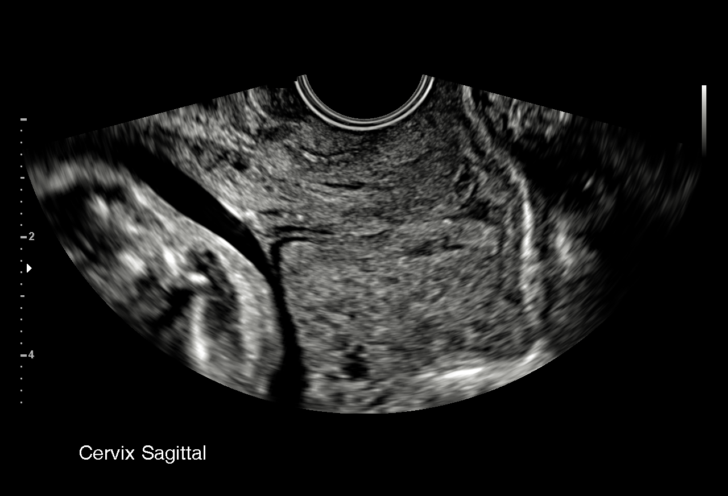
[im 22/22]
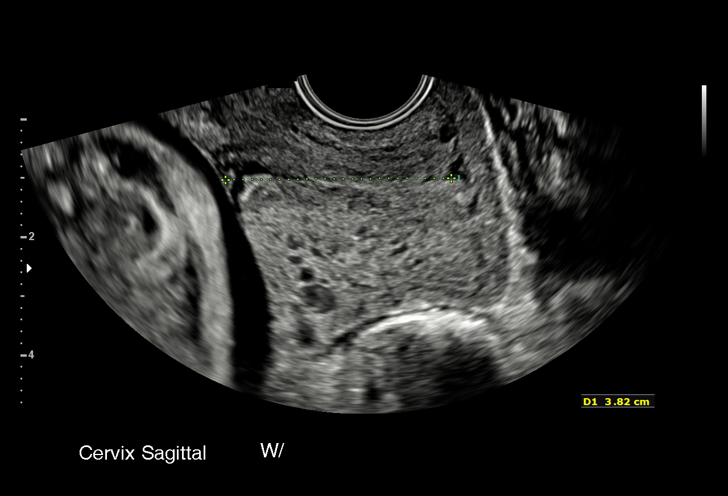

[15 of 22 positions shown; findings below may reference images not displayed]

1  US MFM OB LIMITED                    76815.01     LATVALA EEMELI
 ----------------------------------------------------------------------

 ----------------------------------------------------------------------
Indications

  Threatened preterm labor, antepartum
  Encounter for cervical length
  27 weeks gestation of pregnancy
 ----------------------------------------------------------------------
Fetal Evaluation

 Num Of Fetuses:         1
 Fetal Heart Rate(bpm):  150
 Cardiac Activity:       Observed
 Presentation:           Breech
 Placenta:               Anterior

 Amniotic Fluid
 AFI FV:      Within normal limits

                             Largest Pocket(cm)

Gestational Age

 Clinical EDD:  27w 0d                                        EDD:   04/23/19
 Best:          27w 0d     Det. By:  Clinical EDD             EDD:   04/23/19
Cervix Uterus Adnexa

 Cervix
 Length:            3.5  cm.
 Measured transvaginally.
Impression

 Patient is admitted with diagnosis of preterm labor.
 Limited ultrasound study was performed.  Amniotic fluid is
 normal good fetal activity seen.  We performed a transvaginal
 ultrasound to evaluate the cervical length.  The cervix
 measures 3.5 cm, which is within normal limits.
 Normal cervical length measurement has a high negative
 predictive value for preterm delivery.  Negative FFN also has
 a higher negative predictive value.

 If patient does not have symptoms of uterine contractions,
 she may be discharged after completion of antenatal
 corticosteroids.
                 Rasim, Ferdjallah

## 2020-11-24 DIAGNOSIS — R0981 Nasal congestion: Secondary | ICD-10-CM | POA: Diagnosis not present

## 2020-12-24 DIAGNOSIS — H1031 Unspecified acute conjunctivitis, right eye: Secondary | ICD-10-CM | POA: Diagnosis not present

## 2021-02-17 DIAGNOSIS — R202 Paresthesia of skin: Secondary | ICD-10-CM | POA: Diagnosis not present

## 2021-02-17 DIAGNOSIS — G43109 Migraine with aura, not intractable, without status migrainosus: Secondary | ICD-10-CM | POA: Diagnosis not present

## 2021-02-25 ENCOUNTER — Encounter: Payer: Self-pay | Admitting: Neurology

## 2021-04-17 NOTE — Progress Notes (Signed)
? ?NEUROLOGY CONSULTATION NOTE ? ?Jackie [name] ?MRN: [id number] ?DOB: 14-Jul-1990 ? ?Referring provider: Clementeen Graham, PA-C ?Primary care provider: Clementeen Graham, PA-C ? ?Reason for consult:  headaches ? ?Assessment/Plan:  ? ?Migraine without aura, without status migrainosus, not intractable ?Medication overuse headache ?Migraine with aura ? ?Migraine prevention:  start Aimovig 140mg  every 28 days ?Migraine rescue:  100mg  ?Stop Tylenol and ibuprofen. Taking NSAIDs/analgesics more than 2 days out of week may cause rebound or medication-overuse headache. ?Keep headache diary ?Follow up 5 months. ? ? ? ?Subjective:  ?Jackie [name] is a 31 year old female with hypothyroidism and GERD who presents for headaches.  History supplemented by prior neurologist's and referring provider's notes. ? ?Onset:  31 years old ?Location:  varies - usually at crown or down left side of temple and face ?Quality:  usually squeezing but also stabbing ?Intensity:  usually 6-7/10, 10/10 at its worse ?Aura:  none ?Prodrome:  absent ?Associated symptoms:  Nausea, photophobia, phonophobia, Vomiting if severe.  She denies associated visual disturbance, unilateral numbness or weakness. ?Duration:  may last 1 day to several days ?Frequency:  once every other week ?Frequency of abortive medication: Tylenol daily ?Triggers:  unknown ?Relieving factors:  sometimes Tylenol ?Activity:  affected if severe ? ?She has a baseline dull persistent headache "my whole life" ? ?She has past history of migraine aura without headache.  Left sided numbness and paralysis for 10 minutes.  They used to occur often.  They resolved after she became pregnant in 2018. ? ?CT head on 03/18/2008 personally reviewed was normal. ? ?Current NSAIDS/analgesics:  Tylenol, Advil ?Current triptans:  none ?Current ergotamine:  none ?Current anti-emetic:  none ?Current muscle relaxants:  none ?Current Antihypertensive medications:  none ?Current Antidepressant medications:   none ?Current Anticonvulsant medications:  none ?Current anti-CGRP:  none ?Current Vitamins/Herbal/Supplements:  none ?Current Antihistamines/Decongestants:  none ?Other therapy:  absent ?Hormone/birth control:  absent ? ? ?Past NSAIDS/analgesics:  naproxen ?Past abortive triptans:  rizatriptan, sumatriptan tab, eletriptan, zolmitriptan ?Past abortive ergotamine:  none ?Past muscle relaxants:  cylcobenzaprine ?Past anti-emetic:  promethazine, ondansetron ?Past antihypertensive medications:  propranolol ?Past antidepressant medications:  amitriptyline, imipramine ?Past anticonvulsant medications:  topiramate, zonisamide ?Past anti-CGRP:  none ?Past vitamins/Herbal/Supplements:  none ?Past antihistamines/decongestants:  none ?Other past therapies:  none ? ?Caffeine:  coffee no more than once a week. ?Alcohol:  occasional ?Smoker:  no ?Diet:  Drinks a lot of water. Does not skip meals ?Exercise:  not routine.  Some walks ?Depression:  no; Anxiety:  no ?Other pain:  some low back pain (DDD) ?Sleep hygiene:  good ?Family history of headache:  Father (migraines) ? ?  ? ? ?PAST MEDICAL HISTORY: ?Past Medical History:  ?Diagnosis Date  ? Chondromalacia of right patella 10/2013  ? Compartment syndrome, nontraumatic, lower extremity 10/2013  ? right  ? GERD (gastroesophageal reflux disease)   ? Hypothyroidism   ? Migraines   ? Retroversion, uterus   ? ? ?PAST SURGICAL HISTORY: ?Past Surgical History:  ?Procedure Laterality Date  ? CESAREAN SECTION N/A 04/03/2019  ? Procedure: CESAREAN SECTION;  Surgeon: 11/2013, MD;  Location: [facility];  Service: Obstetrics;  Laterality: N/A;  ? FASCIOTOMY Right 10/30/2013  ? Procedure: RELEASE RIGHT ANTERIOR COMPARTMENT FASCIOTOMY ;  Surgeon: Shea Evans, MD;  Location: [facility];  Service: Orthopedics;  Laterality: Right;  ? KNEE ARTHROSCOPY W/ PLICA EXCISION Left 07/12/2010  ? KNEE ARTHROSCOPY WITH FULKERSON SLIDE Right 10/30/2013  ? Procedure: RIGHT KNEE SCOPE  LATERAL RELEASE/CHONDROPLASTY ANTERIOR TIBIAL TUBERCLEPLASTY;  Surgeon: Loreta Ave, MD;  Location: [facility];  Service: Orthopedics;  Laterality: Right;  ANESTHESIA:  GENERAL, PRE/POST OP FEMORAL NERVE  ? KNEE ARTHROSCOPY WITH LATERAL RELEASE Left 07/24/2013  ? Procedure: LEFT ARTHROSCOPY KNEE WITH LATERAL RELEASE,WITH DEBRIDEMENT/SHAVING (CHONDROPLASTY) ANTERIOR TIBIAL TUBERCLEPLASTY;  Surgeon: Loreta Ave, MD;  Location: [facility];  Service: Orthopedics;  Laterality: Left;  ? KNEE SURGERY Bilateral   ? ? ?MEDICATIONS: ?Current Outpatient Medications on File Prior to Visit  ?Medication Sig Dispense Refill  ? acetaminophen (TYLENOL) 500 MG tablet Take 1,000 mg by mouth every 6 (six) hours as needed for headache.    ? calcium carbonate (TUMS - DOSED IN MG ELEMENTAL CALCIUM) 500 MG chewable tablet Chew 2 tablets by mouth 2 (two) times daily as needed for indigestion or heartburn.    ? hydrocortisone (ANUSOL-HC) 2.5 % rectal cream Place 1 application rectally 3 (three) times daily as needed for hemorrhoids.     ? pantoprazole (PROTONIX) 40 MG tablet Take 40 mg by mouth daily.    ? Prenatal Vit-Fe Fumarate-FA (PRENATAL MULTIVITAMIN) TABS tablet Take 1 tablet by mouth daily at 12 noon.    ? ?No current facility-administered medications on file prior to visit.  ? ? ?ALLERGIES: ?Allergies  ?Allergen Reactions  ? Topamax [Topiramate] Other (See Comments)  ?  NUMBNESS OF HANDS AND FACE  ? ? ?FAMILY HISTORY: ?Family History  ?Problem Relation Age of Onset  ? Thyroid disease Mother   ? Diabetes Paternal Grandmother   ? Migraines Father   ? ? ?Objective:  ?Blood pressure 108/75, pulse 89, height 5\' 8"  (1.727 m), weight 190 lb 9.6 oz (86.5 kg), SpO2 96 %, unknown if currently breastfeeding. ?General: No acute distress.  Patient appears well-groomed.   ?Head:  Normocephalic/atraumatic ?Eyes:  fundi examined but not visualized ?Neck: supple, no paraspinal tenderness, full range of  motion ?Heart: regular rate and rhythm ?Neurological Exam: ?Mental status: alert and oriented to person, place, and time, recent and remote memory intact, fund of knowledge intact, attention and concentration intact, speech fluent and not dysarthric, language intact. ?Cranial nerves: ?CN I: not tested ?CN II: pupils equal, round and reactive to light, visual fields intact ?CN III, IV, VI:  full range of motion, no nystagmus, no ptosis ?CN V: facial sensation intact. ?CN VII: upper and lower face symmetric ?CN VIII: hearing intact ?CN IX, X: gag intact, uvula midline ?CN XI: sternocleidomastoid and trapezius muscles intact ?CN XII: tongue midline ?Bulk & Tone: normal, no fasciculations. ?Motor:  muscle strength 5/5 throughout ?Sensation:  temperature and vibratory sensation intact. ?Deep Tendon Reflexes:  2+ throughout,  toes downgoing.   ?Finger to nose testing:  Without dysmetria.   ?Heel to shin:  Without dysmetria.   ?Gait:  Normal station and stride.  Romberg negative. ? ? ? ?Thank you for allowing me to take part in the care of this patient. ? ? , DO ? ?CC: Dorothy Shon Millet, PA-C ? ? ? ? ?

## 2021-04-18 ENCOUNTER — Telehealth (HOSPITAL_COMMUNITY): Payer: Self-pay | Admitting: Pharmacy Technician

## 2021-04-18 ENCOUNTER — Ambulatory Visit (INDEPENDENT_AMBULATORY_CARE_PROVIDER_SITE_OTHER): Payer: BC Managed Care – PPO | Admitting: Neurology

## 2021-04-18 ENCOUNTER — Encounter: Payer: Self-pay | Admitting: Neurology

## 2021-04-18 VITALS — BP 108/75 | HR 89 | Ht 68.0 in | Wt 190.6 lb

## 2021-04-18 DIAGNOSIS — G43109 Migraine with aura, not intractable, without status migrainosus: Secondary | ICD-10-CM

## 2021-04-18 DIAGNOSIS — G4440 Drug-induced headache, not elsewhere classified, not intractable: Secondary | ICD-10-CM

## 2021-04-18 DIAGNOSIS — G43009 Migraine without aura, not intractable, without status migrainosus: Secondary | ICD-10-CM | POA: Diagnosis not present

## 2021-04-18 MED ORDER — AIMOVIG 140 MG/ML ~~LOC~~ SOAJ: 140.0000 mg | SUBCUTANEOUS | 11 refills | Status: DC

## 2021-04-18 MED ORDER — UBRELVY 100 MG PO TABS: 1.0000 | ORAL_TABLET | ORAL | 11 refills | Status: DC | PRN

## 2021-04-18 NOTE — Telephone Encounter (Signed)
Patient Advocate Encounter ? ?Prior Authorization for Aimovig 140MG /ML auto-injectors has been approved.   ? ?PA# ?Effective dates: 04/18/2021 through 07/18/2021 ? ? ? ? ? ?09/18/2021, CPhT ?Pharmacy Patient Advocate Specialist ?[facility] Patient Advocate Team ?Direct Number: [phone]  Fax: [phone]  ?

## 2021-04-18 NOTE — Telephone Encounter (Signed)
Patient Advocate Encounter ?  ?Received notification that prior authorization for Aimovig 140MG /ML auto-injectors is required. ?  ?PA submitted on 04/18/2021 ?Key 06/18/2021 ?Status is pending ?   ? ? ? ?[id number], CPhT ?Pharmacy Patient Advocate Specialist ?[facility] Patient Advocate Team ?Direct Number: [phone]  Fax: [phone]  ?

## 2021-04-18 NOTE — Patient Instructions (Addendum)
?  Start Aimovig 140mg  every 28 days.   ?Take Ubrelvy 100mg  at earliest onset of headache.  May repeat dose once in 2 hours if needed.  Maximum 2 tablets in 24 hours. ?STOP TYLENOL AND ADVIL ?Limit use of pain relievers to no more than 2 days out of the week.  These medications include acetaminophen, NSAIDs (ibuprofen/Advil/Motrin, naproxen/Aleve, triptans (Imitrex/sumatriptan), Excedrin, and narcotics.  This will help reduce risk of rebound headaches. ?Be aware of common food triggers: ? - Caffeine:  coffee, black tea, cola, Mt. Dew ? - Chocolate ? - Dairy:  aged cheeses (brie, blue, cheddar, gouda, [place], provolone, [place], Swiss, etc), chocolate milk, buttermilk, sour cream, limit eggs and yogurt ? - Nuts, peanut butter ? - Alcohol ? - Cereals/grains:  FRESH breads (fresh bagels, sourdough, doughnuts), yeast productions ? - Processed/canned/aged/cured meats (pre-packaged deli meats, hotdogs) ? - MSG/glutamate:  soy sauce, flavor enhancer, pickled/preserved/marinated foods ? - Sweeteners:  aspartame (Equal, Nutrasweet).  Sugar and Splenda are okay ? - Vegetables:  legumes (lima beans, lentils, snow peas, fava beans, pinto peans, peas, garbanzo beans), sauerkraut, onions, olives, pickles ? - Fruit:  avocados, bananas, citrus fruit (orange, lemon, grapefruit), mango ? - Other:  Frozen meals, macaroni and cheese ?Routine exercise ?Stay adequately hydrated (aim for 64 oz water daily) ?Keep headache diary ?Maintain proper stress management ?Maintain proper sleep hygiene ?Do not skip meals ?Consider supplements:  magnesium citrate 400mg  daily, riboflavin 400mg  daily, coenzyme Q10 100mg  three times daily. ? ?

## 2021-04-19 ENCOUNTER — Encounter: Payer: Self-pay | Admitting: Neurology

## 2021-04-22 ENCOUNTER — Telehealth (HOSPITAL_COMMUNITY): Payer: Self-pay | Admitting: Pharmacy Technician

## 2021-04-22 NOTE — Telephone Encounter (Signed)
Patient Advocate Encounter ? ?Prior Authorization for Jackie [name] 100MG  tablets has been approved.   ? ?PA# [id number] ?Effective dates: 04/22/2021 through 07/14/2021 ? ? ? ? ? ?Lyndel Safe, CPhT ?Pharmacy Patient Advocate Specialist ?[facility] Patient Advocate Team ?Direct Number: [phone]  Fax: [phone]  ?

## 2021-04-22 NOTE — Telephone Encounter (Signed)
Patient Advocate Encounter ?  ?Received notification  that prior authorization for Ubrelvy 100MG  tablets is required. ?  ?PA submitted on 04/22/2021 ?Key [id number] ?Status is pending ?   ? ? ? ?04/24/2021, CPhT ?Pharmacy Patient Advocate Specialist ?[facility] Patient Advocate Team ?Direct Number: [phone]  Fax: [phone]  ?

## 2021-04-24 DIAGNOSIS — G43909 Migraine, unspecified, not intractable, without status migrainosus: Secondary | ICD-10-CM | POA: Diagnosis not present

## 2021-04-24 DIAGNOSIS — J020 Streptococcal pharyngitis: Secondary | ICD-10-CM | POA: Diagnosis not present

## 2021-04-24 DIAGNOSIS — G43019 Migraine without aura, intractable, without status migrainosus: Secondary | ICD-10-CM | POA: Diagnosis not present

## 2021-06-07 ENCOUNTER — Ambulatory Visit: Payer: BC Managed Care – PPO | Admitting: Neurology

## 2021-06-23 ENCOUNTER — Telehealth (HOSPITAL_COMMUNITY): Payer: Self-pay | Admitting: Pharmacy Technician

## 2021-06-23 NOTE — Telephone Encounter (Signed)
Patient Advocate Encounter  Prior Authorization for Aimovig 140MG /ML auto-injectors has been approved.    PA# Effective dates: 06/21/2021 through 09/21/2021      09/23/2021, CPhT Pharmacy Patient Advocate Specialist [facility] Patient Advocate Team Direct Number: [phone]  Fax: [phone]

## 2021-06-23 NOTE — Telephone Encounter (Signed)
Patient Advocate Encounter   Received notification that prior authorization for Aimovig 140MG /ML auto-injectors is required.   PA submitted on 06/20/2021 Key [id number] Status is pending       08/20/2021, CPhT Pharmacy Patient Advocate Specialist [facility] Patient Advocate Team Direct Number: [phone]  Fax: [phone]

## 2021-07-14 ENCOUNTER — Other Ambulatory Visit (HOSPITAL_COMMUNITY): Payer: Self-pay

## 2021-09-14 NOTE — Progress Notes (Signed)
NEUROLOGY FOLLOW UP OFFICE NOTE  KAMERA DUBAS [id number]  Assessment/Plan:   Migraine without aura, without status migrainosus, not intractable Migraine with aura   Migraine prevention:  Aimovig 140mg  every 28 days.  Will have her take a Nurtec daily for the last week prior to next Aimovig injection Migraine rescue:  Excedrin Limit use of pain relievers to no more than 2 days out of week to prevent risk of rebound or medication-overuse headache. Keep headache diary Follow up 6 months.     Subjective:  Jessicamarie L Bonds is a 31 year old female with hypothyroidism and GERD who follows up for migraines.  UPDATE: Started Aimovig and Ubrelvy. Excedrin Migraine works better. Intensity:  severe Duration:  within 2 hours. Frequency:  3-4 a month.  Will have 3 migraines last week prior to next injection but also with a persistent headache for that entire week. Current NSAIDS/analgesics:  none Current triptans:  none Current ergotamine:  none Current anti-emetic:  none Current muscle relaxants:  none Current Antihypertensive medications:  none Current Antidepressant medications:  none Current Anticonvulsant medications:  none Current anti-CGRP:  Aimovig 140mg , Ubrelvy 100mg  Current Vitamins/Herbal/Supplements:  none Current Antihistamines/Decongestants:  none Other therapy:  absent Hormone/birth control:  absent  Caffeine:  coffee no more than once a week. Alcohol:  occasional Smoker:  no Diet:  Drinks a lot of water. Does not skip meals Exercise:  not routine.  Some walks Depression:  no; Anxiety:  no Other pain:  some low back pain (DDD) Sleep hygiene:  good  HISTORY:  Onset:  31 years old Location:  varies - usually at crown or down left side of temple and face Quality:  usually squeezing but also stabbing Intensity:  usually 6-7/10, 10/10 at its worse Aura:  none Prodrome:  absent Associated symptoms:  Nausea, photophobia, phonophobia, Vomiting if severe.  She denies  associated visual disturbance, unilateral numbness or weakness. Duration:  may last 1 day to several days Frequency:  once every other week Frequency of abortive medication: Tylenol daily Triggers:  unknown Relieving factors:  sometimes Tylenol Activity:  affected if severe   She has a baseline dull persistent headache "my whole life"   She has past history of migraine aura without headache.  Left sided numbness and paralysis for 10 minutes.  They used to occur often.  They resolved after she became pregnant in 2018.   CT head on 03/18/2008 was normal.     Past NSAIDS/analgesics:  naproxen, ibuprofen, acetaminophen Past abortive triptans:  rizatriptan, sumatriptan tab, eletriptan, zolmitriptan Past abortive ergotamine:  none Past muscle relaxants:  cylcobenzaprine Past anti-emetic:  promethazine, ondansetron Past antihypertensive medications:  propranolol Past antidepressant medications:  amitriptyline, imipramine Past anticonvulsant medications:  topiramate, zonisamide Past anti-CGRP:  none Past vitamins/Herbal/Supplements:  none Past antihistamines/decongestants:  none Other past therapies:  none    Family history of headache:  Father (migraines)  PAST MEDICAL HISTORY: Past Medical History:  Diagnosis Date   Chondromalacia of right patella 10/2013   Compartment syndrome, nontraumatic, lower extremity 10/2013   right   GERD (gastroesophageal reflux disease)    Hypothyroidism    Migraines    Retroversion, uterus     MEDICATIONS: Current Outpatient Medications on File Prior to Visit  Medication Sig Dispense Refill   acetaminophen (TYLENOL) 500 MG tablet Take 1,000 mg by mouth every 6 (six) hours as needed for headache.     Erenumab-aooe (AIMOVIG) 140 MG/ML SOAJ Inject 140 mg into the skin every 28 (twenty-eight) days.  1.12 mL 11   Ubrogepant (UBRELVY) 100 MG TABS Take 1 tablet by mouth as needed (May repeat after 2 hours.  Maximum 2 tablets in 24 hours). 16 tablet 11    No current facility-administered medications on file prior to visit.    ALLERGIES: Allergies  Allergen Reactions   Topamax [Topiramate] Other (See Comments)    NUMBNESS OF HANDS AND FACE    FAMILY HISTORY: Family History  Problem Relation Age of Onset   Thyroid disease Mother    Diabetes Paternal Grandmother    Migraines Father       Objective:  Blood pressure 115/77, pulse 70, height 5\' 8"  (1.727 m), weight 194 lb 12.8 oz (88.4 kg), SpO2 99 %, unknown if currently breastfeeding. General: No acute distress.  Patient appears well-groomed.    , DO  CC: Shon Millet, PA-C

## 2021-09-19 ENCOUNTER — Ambulatory Visit (INDEPENDENT_AMBULATORY_CARE_PROVIDER_SITE_OTHER): Payer: BC Managed Care – PPO | Admitting: Neurology

## 2021-09-19 ENCOUNTER — Encounter: Payer: Self-pay | Admitting: Neurology

## 2021-09-19 VITALS — BP 115/77 | HR 70 | Ht 68.0 in | Wt 194.8 lb

## 2021-09-19 DIAGNOSIS — G43109 Migraine with aura, not intractable, without status migrainosus: Secondary | ICD-10-CM

## 2021-09-19 DIAGNOSIS — G43009 Migraine without aura, not intractable, without status migrainosus: Secondary | ICD-10-CM

## 2021-09-19 NOTE — Patient Instructions (Signed)
Continue Aimovig 140mg  every 28 days Take Nurtec 1 tablet daily beginning 7 days before the next Aimovig injection until day after injection.  Let me know if effective and would like a prescription Limit use of pain relievers to no more than 2 days out of week to prevent risk of rebound or medication-overuse headache. Keep headache diary Follow up 6 months.

## 2021-09-22 ENCOUNTER — Telehealth: Payer: Self-pay | Admitting: Pharmacy Technician

## 2021-09-22 NOTE — Telephone Encounter (Signed)
Patient Advocate Encounter   Received notification that prior authorization for Aimovig 140MG /ML auto-injectors is required.   PA submitted on 09/22/2021 Key [id number] Status is pending       09/24/2021, CPhT Pharmacy Patient Advocate Specialist [facility] Patient Advocate Team Direct Number: [phone]  Fax: [phone]

## 2021-09-28 NOTE — Telephone Encounter (Signed)
Received fax from [facility] stating additional info is required. Per fax, initial PA form was completed via CMM as a "continuation of coverage" however pt appears to have been previously approved through a different insurance. Insurance requires that the attached "initial coverage" form be completed and faxed back. Will await response.  Fax# [phone] Phone# [phone], then opt #3, opt #4, and then opt #2

## 2021-09-29 ENCOUNTER — Encounter: Payer: Self-pay | Admitting: Neurology

## 2021-09-29 ENCOUNTER — Other Ambulatory Visit: Payer: Self-pay

## 2021-09-29 ENCOUNTER — Other Ambulatory Visit (HOSPITAL_COMMUNITY): Payer: Self-pay

## 2021-09-29 MED ORDER — AIMOVIG 140 MG/ML ~~LOC~~ SOAJ: 140.0000 mg | SUBCUTANEOUS | 4 refills | Status: DC

## 2021-09-30 NOTE — Telephone Encounter (Signed)
Received a fax regarding Prior Authorization for Aimovig 140MG/ML auto-injectors.   Authorization has been DENIED due to criteria not met. This medication is covered: - when the member is treated for the prevention of chronic migraines defined as 15 or more headache days per month with at least 8 days of migraines per month, or - when the medication is used to prevent episodic migraines (having less than 15 headache days per month), including one of the following in the past 3 months: - 4 or more migraine days per month - migraines affect the quality of daily life despite short term treatment (with medications such as sumatriptan, rizatriptan, etc) - when the member tried short term treatment medications and they did not work, or were not tolerated - when the member is at risk of medication overuse headache (daily or long-term headaches where short term medications are used too much) without a medication to prevent migraines - when the member's frequent migraines are not explained by other medical conditions (such as unstable chest pain, history of stroke, bleeding in the brain, etc.).  Determination letter attached to patient chart.

## 2021-09-30 NOTE — Telephone Encounter (Signed)
   Routing for initiation of appeal process. Be advised of above information that is in direct response to "unmet criteria" listed in denial letter.

## 2021-10-03 NOTE — Telephone Encounter (Signed)
LMOVM for patient starting Appeal process for Aimovig.

## 2021-10-03 NOTE — Telephone Encounter (Signed)
Called and informed her to come and pick up samples.

## 2021-10-07 ENCOUNTER — Other Ambulatory Visit (HOSPITAL_COMMUNITY): Payer: Self-pay

## 2021-10-07 NOTE — Telephone Encounter (Signed)
Received fax from [facility] in regards to [place] has been APPROVED.  Effective dates: 09-22-2021 to 12-14-2021  Appeal approval letter attached to patient chart.

## 2021-10-10 NOTE — Telephone Encounter (Signed)
Telephone call to patient to advised no answer. LMOVM.

## 2021-11-23 ENCOUNTER — Encounter: Payer: Self-pay | Admitting: Neurology

## 2021-12-05 NOTE — Progress Notes (Unsigned)
Virtual Visit via Video Note  Consent was obtained for video visit:  Yes.   Answered questions that patient had about telehealth interaction:  Yes.   I discussed the limitations, risks, security and privacy concerns of performing an evaluation and management service by telemedicine. I also discussed with the patient that there may be a patient responsible charge related to this service. The patient expressed understanding and agreed to proceed.  Pt location: Home Physician Location: office Name of referring provider:  No ref. provider found I connected with Jackie [name] at patients initiation/request on 12/06/2021 at 10:30 AM EST by video enabled telemedicine application and verified that I am speaking with the correct person using two identifiers. Pt MRN:  [id number] Pt DOB:  [birth year] Video Participants:  Jackie [name] Current   Assessment/Plan:    Migraine without aura, without status migrainosus, not intractable Migraine with aura   Migraine prevention:  Aimovig 140mg  every 28 days.  Will have her take a Nurtec daily for the last week prior to next Aimovig injection Migraine rescue:  Excedrin Limit use of pain relievers to no more than 2 days out of week to prevent risk of rebound or medication-overuse headache. Keep headache diary Follow up 6 months.     Subjective:  Jackie [name] is a 31 year old female with hypothyroidism and GERD who follows up for migraines.   UPDATE:  Intensity:  severe Duration:  within 2 hours. Frequency:  3-4 a month.  Will have 3 migraines last week prior to next injection but also with a persistent headache for that entire week. Current NSAIDS/analgesics:  none Current triptans:  none Current ergotamine:  none Current anti-emetic:  none Current muscle relaxants:  none Current Antihypertensive medications:  none Current Antidepressant medications:  none Current Anticonvulsant medications:  none Current anti-CGRP:  Aimovig 140mg , Ubrelvy  100mg  Current Vitamins/Herbal/Supplements:  none Current Antihistamines/Decongestants:  none Other therapy:  absent Hormone/birth control:  absent   Caffeine:  coffee no more than once a week. Alcohol:  occasional Smoker:  no Diet:  Drinks a lot of water. Does not skip meals Exercise:  not routine.  Some walks Depression:  no; Anxiety:  no Other pain:  some low back pain (DDD) Sleep hygiene:  good   HISTORY:  Onset:  31 years old Location:  varies - usually at crown or down left side of temple and face Quality:  usually squeezing but also stabbing Intensity:  usually 6-7/10, 10/10 at its worse Aura:  none Prodrome:  absent Associated symptoms:  Nausea, photophobia, phonophobia, Vomiting if severe.  She denies associated visual disturbance, unilateral numbness or weakness. Duration:  may last 1 day to several days Frequency:  once every other week Frequency of abortive medication: Tylenol daily Triggers:  unknown Relieving factors:  sometimes Tylenol Activity:  affected if severe   She has a baseline dull persistent headache "my whole life"   She has past history of migraine aura without headache.  Left sided numbness and paralysis for 10 minutes.  They used to occur often.  They resolved after she became pregnant in 2018.   CT head on 03/18/2008 was normal.     Past NSAIDS/analgesics:  naproxen, ibuprofen, acetaminophen Past abortive triptans:  rizatriptan, sumatriptan tab, eletriptan, zolmitriptan Past abortive ergotamine:  none Past muscle relaxants:  cylcobenzaprine Past anti-emetic:  promethazine, ondansetron Past antihypertensive medications:  propranolol Past antidepressant medications:  amitriptyline, imipramine Past anticonvulsant medications:  topiramate, zonisamide Past anti-CGRP:  none Past vitamins/Herbal/Supplements:  none  Past antihistamines/decongestants:  none Other past therapies:  none  Past Medical History: Past Medical History:  Diagnosis Date    Chondromalacia of right patella 10/2013   Compartment syndrome, nontraumatic, lower extremity 10/2013   right   GERD (gastroesophageal reflux disease)    Hypothyroidism    Migraines    Retroversion, uterus     Medications: Outpatient Encounter Medications as of 12/06/2021  Medication Sig   acetaminophen (TYLENOL) 500 MG tablet Take 1,000 mg by mouth every 6 (six) hours as needed for headache.   Erenumab-aooe (AIMOVIG) 140 MG/ML SOAJ Inject 140 mg into the skin every 28 (twenty-eight) days.   No facility-administered encounter medications on file as of 12/06/2021.    Allergies: Allergies  Allergen Reactions   Topamax [Topiramate] Other (See Comments)    NUMBNESS OF HANDS AND FACE    Family History: Family History  Problem Relation Age of Onset   Thyroid disease Mother    Diabetes Paternal Grandmother    Migraines Father     Observations/Objective:   No acute distress.  Alert and oriented.  Speech fluent and not dysarthric.  Language intact.  Eyes orthophoric on primary gaze.  Face symmetric.   Follow Up Instructions:    -I discussed the assessment and treatment plan with the patient. The patient was provided an opportunity to ask questions and all were answered. The patient agreed with the plan and demonstrated an understanding of the instructions.   The patient was advised to call back or seek an in-person evaluation if the symptoms worsen or if the condition fails to improve as anticipated.   Cira Servant, DO

## 2021-12-06 ENCOUNTER — Telehealth (INDEPENDENT_AMBULATORY_CARE_PROVIDER_SITE_OTHER): Payer: BC Managed Care – PPO | Admitting: Neurology

## 2021-12-06 ENCOUNTER — Other Ambulatory Visit: Payer: Self-pay | Admitting: Neurology

## 2021-12-06 ENCOUNTER — Encounter: Payer: Self-pay | Admitting: Neurology

## 2021-12-06 VITALS — Ht 67.0 in | Wt 180.0 lb

## 2021-12-06 DIAGNOSIS — G43009 Migraine without aura, not intractable, without status migrainosus: Secondary | ICD-10-CM

## 2021-12-06 DIAGNOSIS — G43409 Hemiplegic migraine, not intractable, without status migrainosus: Secondary | ICD-10-CM | POA: Diagnosis not present

## 2021-12-06 MED ORDER — VENLAFAXINE HCL ER 37.5 MG PO CP24: ORAL_CAPSULE | ORAL | 0 refills | Status: DC

## 2021-12-06 NOTE — Patient Instructions (Addendum)
Start venlafaxine ER 37.5mg  - take 1 pill every morning with breakfast for one week, then increase to 2 pills every morning with breakfast Continue Aimovig and Ubrelvy Check routine EEG - if normal, will order ambulatory EEG Check MRI of brain with and without contrast - if you do not receive a call to schedule MRI within a week, contact [facility] at [phone] Follow up 4 months.

## 2021-12-07 ENCOUNTER — Other Ambulatory Visit: Payer: Self-pay | Admitting: Neurology

## 2021-12-13 ENCOUNTER — Ambulatory Visit (INDEPENDENT_AMBULATORY_CARE_PROVIDER_SITE_OTHER): Payer: BC Managed Care – PPO | Admitting: Neurology

## 2021-12-13 DIAGNOSIS — G43409 Hemiplegic migraine, not intractable, without status migrainosus: Secondary | ICD-10-CM | POA: Diagnosis not present

## 2021-12-13 NOTE — Progress Notes (Unsigned)
EEG complete - results pending 

## 2021-12-15 ENCOUNTER — Telehealth: Payer: Self-pay

## 2021-12-15 DIAGNOSIS — G43409 Hemiplegic migraine, not intractable, without status migrainosus: Secondary | ICD-10-CM

## 2021-12-15 NOTE — Telephone Encounter (Signed)
Patient advised of her EEG results.  72 hour EEG schedule

## 2021-12-15 NOTE — Telephone Encounter (Signed)
-----   Message from Drema Dallas, DO sent at 12/15/2021  7:30 AM EST ----- Routine EEG is normal.  I would like to order a 72 hour ambulatory EEG to try and capture one of her complicated migraines on recording.

## 2021-12-15 NOTE — Procedures (Signed)
ELECTROENCEPHALOGRAM REPORT  Date of Study: 12/13/2021  Patient's Name: Jackie [name] MRN: [id number] Date of Birth: 27-Jul-1990   Clinical History: 31 year old female with recurrent hemiplegic migraines  Medications: Venlafaxine Aimovig Acetaminophen Excedrin  Technical Summary: A multichannel digital EEG recording measured by the international 10-20 system with electrodes applied with paste and impedances below 5000 ohms performed in our laboratory with EKG monitoring in an awake and drowsy patient.  Hyperventilation and photic stimulation were  performed.  The digital EEG was referentially recorded, reformatted, and digitally filtered in a variety of bipolar and referential montages for optimal display.    Description: The patient is awake and drowsy during the recording.  During maximal wakefulness, there is a symmetric, medium voltage 10 Hz posterior dominant rhythm that attenuates with eye opening.  The record is symmetric.  During drowsiness, there is an increase in theta slowing of the background.  Stage 2 sleep was not seen.  Hyperventilation and photic stimulation did not elicit any abnormalities.  There were no epileptiform discharges or electrographic seizures seen.    EKG lead was unremarkable.  Impression: This awake and drowsy EEG is normal.    Clinical Correlation: A normal EEG does not exclude a clinical diagnosis of epilepsy.  If further clinical questions remain, prolonged EEG may be helpful.  Clinical correlation is advised.   Shon Millet, DO

## 2021-12-29 ENCOUNTER — Encounter: Payer: Self-pay | Admitting: Neurology

## 2021-12-31 ENCOUNTER — Ambulatory Visit
Admission: RE | Admit: 2021-12-31 | Discharge: 2021-12-31 | Disposition: A | Discharge status: Home / Self Care | Payer: BC Managed Care – PPO | Source: Ambulatory Visit | Attending: Neurology | Admitting: Neurology

## 2021-12-31 DIAGNOSIS — G43409 Hemiplegic migraine, not intractable, without status migrainosus: Secondary | ICD-10-CM | POA: Diagnosis not present

## 2021-12-31 MED ORDER — GADOPICLENOL 0.5 MMOL/ML IV SOLN
7.0000 mL | Freq: Once | INTRAVENOUS | Status: AC | PRN
  Administered 2021-12-31: 7 mL via INTRAVENOUS

## 2022-01-19 ENCOUNTER — Telehealth: Payer: Self-pay | Admitting: Neurology

## 2022-01-19 NOTE — Telephone Encounter (Signed)
Pt called in stating she now has a different primary insurance. I have updated it in the system. Her pharmacy can't fill her Aimovig. They need prior auth and the drug step up medications.

## 2022-01-19 NOTE — Telephone Encounter (Signed)
Advised patient we will advise the PA team of a new PA needed under her new insruance. May take up two weeks.  PA team please start a new PA with the new insurance.

## 2022-02-01 ENCOUNTER — Other Ambulatory Visit (HOSPITAL_COMMUNITY): Payer: Self-pay

## 2022-02-06 ENCOUNTER — Other Ambulatory Visit (HOSPITAL_COMMUNITY): Payer: Self-pay

## 2022-03-13 ENCOUNTER — Other Ambulatory Visit: Payer: Self-pay | Admitting: Neurology

## 2022-03-13 ENCOUNTER — Other Ambulatory Visit (HOSPITAL_COMMUNITY): Payer: Self-pay

## 2022-03-13 MED ORDER — VENLAFAXINE HCL ER 37.5 MG PO CP24
ORAL_CAPSULE | ORAL | 0 refills | Status: DC
  Filled 2022-03-13: qty 60, 30d supply, fill #0

## 2022-03-14 ENCOUNTER — Other Ambulatory Visit: Payer: Self-pay

## 2022-03-16 ENCOUNTER — Encounter (HOSPITAL_COMMUNITY): Payer: Self-pay

## 2022-03-16 ENCOUNTER — Other Ambulatory Visit (HOSPITAL_COMMUNITY): Payer: Self-pay

## 2022-03-17 ENCOUNTER — Other Ambulatory Visit: Payer: Self-pay

## 2022-03-20 ENCOUNTER — Ambulatory Visit: Payer: BC Managed Care – PPO | Admitting: Neurology

## 2022-04-05 NOTE — Progress Notes (Deleted)
NEUROLOGY FOLLOW UP OFFICE NOTE  Jackie [name] [id number]  Assessment/Plan:   Migraine without aura, without status migrainosus, not intractable Hemiplegic migraine   Migraine prevention:  Aimovig 140mg  every 28 days; venlafaxine XR 75mg  daily *** Migraine rescue:  Excedrin *** Limit use of pain relievers to no more than 2 days out of week to prevent risk of rebound or medication-overuse headache. Keep headache diary Follow up ***     Subjective:  Jackie [name] is a 32 year old female with hypothyroidism and GERD who follows up for migraines.   UPDATE: Due to increased frequency of hemiplegic migraines, she underwent MRI of brain with and without contrast on 12/31/2021, which was personally reviewed and exhibited some artifact on the susceptibility-weighted sequence but overall unremarkable.  Routine EEG on 12/13/2021 was normal.    Started venlafaxine to try and reduce hemiplegic migraines.  ***  Intensity:  severe Duration:  within 2 hours. Frequency:  1 a week.  Will have 2 migraines last week prior to next injection but also with a persistent headache for that entire week.    Current NSAIDS/analgesics:  none Current triptans:  none Current ergotamine:  none Current anti-emetic:  none Current muscle relaxants:  none Current Antihypertensive medications:  none Current Antidepressant medications:  venlafaxine XR 75mg  daily Current Anticonvulsant medications:  none Current anti-CGRP:  Aimovig 140mg , Ubrelvy 100mg  Current Vitamins/Herbal/Supplements:  none Current Antihistamines/Decongestants:  none Other therapy:  absent Hormone/birth control:  absent   Caffeine:  coffee no more than once a week. Alcohol:  occasional Smoker:  no Diet:  Drinks a lot of water. Does not skip meals Exercise:  not routine.  Some walks Depression:  no; Anxiety:  no Other pain:  some low back pain (DDD) Sleep hygiene:  good   HISTORY:  Onset:  32 years old Location:  varies - usually  at crown or down left side of temple and face Quality:  usually squeezing but also stabbing Intensity:  usually 6-7/10, 10/10 at its worse Aura:  none Prodrome:  absent Associated symptoms:  Nausea, photophobia, phonophobia, Vomiting if severe.  She denies associated visual disturbance, unilateral numbness or weakness. Duration:  may last 1 day to several days Frequency:  once every other week Frequency of abortive medication: Tylenol daily Triggers:  unknown Relieving factors:  sometimes Tylenol Activity:  affected if severe   She has a baseline dull persistent headache "my whole life"   She has past history of hemiplegic migraine without headache.  Left sided numbness and paralysis (5 minutes paralysis but another 15-20 minutes with numbness).  They used to occur often.  They resolved after she became pregnant in 2018.  It appears that when her classic migraines are well controlled, she has increased frequency of her hemiplegic migraines.     CT head on 03/18/2008 was normal.     Past NSAIDS/analgesics:  naproxen, ibuprofen, acetaminophen Past abortive triptans:  rizatriptan, sumatriptan tab, eletriptan, zolmitriptan Past abortive ergotamine:  none Past muscle relaxants:  cylcobenzaprine Past anti-emetic:  promethazine, ondansetron Past antihypertensive medications:  propranolol Past antidepressant medications:  amitriptyline, imipramine Past anticonvulsant medications:  topiramate (severe side effects - "brain swelling"), zonisamide Past anti-CGRP:  none Past vitamins/Herbal/Supplements:  none Past antihistamines/decongestants:  none Other past therapies:  none  PAST MEDICAL HISTORY: Past Medical History:  Diagnosis Date   Chondromalacia of right patella 10/2013   Compartment syndrome, nontraumatic, lower extremity 10/2013   right   GERD (gastroesophageal reflux disease)    Hypothyroidism  Migraines    Retroversion, uterus     MEDICATIONS: Current Outpatient  Medications on File Prior to Visit  Medication Sig Dispense Refill   acetaminophen (TYLENOL) 500 MG tablet Take 1,000 mg by mouth every 6 (six) hours as needed for headache.     aspirin-acetaminophen-caffeine (EXCEDRIN MIGRAINE) 250-250-65 MG tablet Take by mouth every 6 (six) hours as needed for headache.     Erenumab-aooe (AIMOVIG) 140 MG/ML SOAJ Inject 140 mg into the skin every 28 (twenty-eight) days. 1.12 mL 4   venlafaxine XR (EFFEXOR-XR) 37.5 MG 24 hr capsule Take 1 capsule daily with breakfast for one week, then increase to 2 capsules daily with breakfast. 60 capsule 0   No current facility-administered medications on file prior to visit.    ALLERGIES: Allergies  Allergen Reactions   Topamax [Topiramate] Other (See Comments)    NUMBNESS OF HANDS AND FACE    FAMILY HISTORY: Family History  Problem Relation Age of Onset   Thyroid disease Mother    Diabetes Paternal Grandmother    Migraines Father       Objective:  *** General: No acute distress.  Patient appears ***-groomed.   Head:  Normocephalic/atraumatic Eyes:  Fundi examined but not visualized Neck: supple, no paraspinal tenderness, full range of motion Heart:  Regular rate and rhythm Lungs:  Clear to auscultation bilaterally Back: No paraspinal tenderness Neurological Exam: alert and oriented to person, place, and time.  Speech fluent and not dysarthric, language intact.  CN II-XII intact. Bulk and tone normal, muscle strength 5/5 throughout.  Sensation to light touch intact.  Deep tendon reflexes 2+ throughout, toes downgoing.  Finger to nose testing intact.  Gait normal, Romberg negative.   Metta Clines, DO  CC: ***

## 2022-04-06 ENCOUNTER — Ambulatory Visit: Payer: 59 | Admitting: Neurology

## 2022-04-17 NOTE — Progress Notes (Signed)
NEUROLOGY FOLLOW UP OFFICE NOTE  Jackie [name] [id number]  Assessment/Plan:   Migraine without aura, without status migrainosus, not intractable Hemiplegic migraine   Migraine prevention: Discontinue Aimovig.  Start Nurtec  every other day Migraine rescue:  Excedrin  Limit use of pain relievers to no more than 2 days out of week to prevent risk of rebound or medication-overuse headache. Keep headache diary Follow up 6 months     Subjective:  Jackie [name] is a 32 year old female with hypothyroidism and GERD who follows up for migraines.   UPDATE: Due to increased frequency of hemiplegic migraines, she underwent MRI of brain with and without contrast on 12/31/2021, which was personally reviewed and exhibited some artifact on the susceptibility-weighted sequence but overall unremarkable.  Routine EEG on 12/13/2021 was normal.    Started venlafaxine to try and reduce hemiplegic migraines.  However, it was not available at any pharmacy.    Intensity:  severe Duration:  within 2 hours with Excedrin Frequency:  3-4 a month (in last 2 weeks prior to next injection) In past 3 months, had 4 hemiplegic migraines   Current NSAIDS/analgesics:  Excedrin Current triptans:  none Current ergotamine:  none Current anti-emetic:  none Current muscle relaxants:  none Current Antihypertensive medications:  none Current Antidepressant medications:  none Current Anticonvulsant medications:  none Current anti-CGRP:  Aimovig  Current Vitamins/Herbal/Supplements:  none Current Antihistamines/Decongestants:  none Other therapy:  absent Hormone/birth control:  absent   Caffeine:  coffee no more than once a week. Alcohol:  occasional Smoker:  no Diet:  Drinks a lot of water. Does not skip meals Exercise:  not routine.  Some walks Depression:  no; Anxiety:  no Other pain:  some low back pain (DDD) Sleep hygiene:  good   HISTORY:  Onset:  32 years old Location:  varies - usually at  crown or down left side of temple and face Quality:  usually squeezing but also stabbing Intensity:  usually 6-7/10, 10/10 at its worse Aura:  none Prodrome:  absent Associated symptoms:  Nausea, photophobia, phonophobia, Vomiting if severe.  She denies associated visual disturbance, unilateral numbness or weakness. Duration:  may last 1 day to several days Frequency:  once every other week Frequency of abortive medication: Tylenol daily Triggers:  unknown Relieving factors:  sometimes Tylenol Activity:  affected if severe   She has a baseline dull persistent headache "my whole life"   She has past history of hemiplegic migraine without headache.  Left sided numbness and paralysis (5 minutes paralysis but another 15-20 minutes with numbness).  They used to occur often.  They resolved after she became pregnant in 2018.  It appears that when her classic migraines are well controlled, she has increased frequency of her hemiplegic migraines.     CT head on 03/18/2008 was normal.     Past NSAIDS/analgesics:  naproxen, ibuprofen, acetaminophen Past abortive triptans:  rizatriptan, sumatriptan tab, eletriptan, zolmitriptan Past abortive ergotamine:  none Past muscle relaxants:  cylcobenzaprine Past anti-emetic:  promethazine, ondansetron Past antihypertensive medications:  propranolol Past antidepressant medications:  amitriptyline, imipramine Past anticonvulsant medications:  topiramate (severe side effects - "brain swelling"), zonisamide Past anti-CGRP:  Ubrelvy (ineffective) Past vitamins/Herbal/Supplements:  none Past antihistamines/decongestants:  none Other past therapies:  none  PAST MEDICAL HISTORY: Past Medical History:  Diagnosis Date   Chondromalacia of right patella 10/2013   Compartment syndrome, nontraumatic, lower extremity 10/2013   right   GERD (gastroesophageal reflux disease)    Hypothyroidism  Migraines    Retroversion, uterus     MEDICATIONS: Current  Outpatient Medications on File Prior to Visit  Medication Sig Dispense Refill   acetaminophen (TYLENOL) 500 MG tablet Take 1,000 mg by mouth every 6 (six) hours as needed for headache.     aspirin-acetaminophen-caffeine (EXCEDRIN MIGRAINE) 250-250-65 MG tablet Take by mouth every 6 (six) hours as needed for headache.     Erenumab-aooe (AIMOVIG) 140 MG/ML SOAJ Inject 140 mg into the skin every 28 (twenty-eight) days. 1.12 mL 4   venlafaxine XR (EFFEXOR-XR) 37.5 MG 24 hr capsule Take 1 capsule daily with breakfast for one week, then increase to 2 capsules daily with breakfast. 60 capsule 0   No current facility-administered medications on file prior to visit.    ALLERGIES: Allergies  Allergen Reactions   Topamax [Topiramate] Other (See Comments)    NUMBNESS OF HANDS AND FACE    FAMILY HISTORY: Family History  Problem Relation Age of Onset   Thyroid disease Mother    Diabetes Paternal Grandmother    Migraines Father       Objective:  Blood pressure 115/79, pulse 75, height 5\' 7"  (1.702 m), weight 183 lb (83 kg), SpO2 95 %, unknown if currently breastfeeding. General: No acute distress.  Patient appears well-groomed.   Head:  Normocephalic/atraumatic Eyes:  Fundi examined but not visualized Neck: supple, no paraspinal tenderness, full range of motion Heart:  Regular rate and rhythm Lungs:  Clear to auscultation bilaterally Back: No paraspinal tenderness Neurological Exam: alert and oriented to person, place, and time.  Speech fluent and not dysarthric, language intact.  CN II-XII intact. Bulk and tone normal, muscle strength 5/5 throughout.  Sensation to light touch intact.  Deep tendon reflexes 2+ throughout, toes downgoing.  Finger to nose testing intact.  Gait normal, Romberg negative.   Shon Millet, DO

## 2022-04-25 ENCOUNTER — Encounter: Payer: Self-pay | Admitting: Neurology

## 2022-04-25 ENCOUNTER — Ambulatory Visit (INDEPENDENT_AMBULATORY_CARE_PROVIDER_SITE_OTHER): Payer: 59 | Admitting: Neurology

## 2022-04-25 VITALS — BP 115/79 | HR 75 | Ht 67.0 in | Wt 183.0 lb

## 2022-04-25 DIAGNOSIS — G43009 Migraine without aura, not intractable, without status migrainosus: Secondary | ICD-10-CM

## 2022-04-25 DIAGNOSIS — G43409 Hemiplegic migraine, not intractable, without status migrainosus: Secondary | ICD-10-CM

## 2022-04-25 MED ORDER — NURTEC 75 MG PO TBDP: 75.0000 mg | ORAL_TABLET | ORAL | 11 refills | Status: DC

## 2022-04-25 NOTE — Patient Instructions (Signed)
Stop Aimovig.  Start Nurtec every other day Excedrin as needed.  Limit use of pain relievers to no more than 2 days out of week to prevent risk of rebound or medication-overuse headache. Keep headache diary

## 2022-06-14 ENCOUNTER — Other Ambulatory Visit: Payer: Self-pay | Admitting: Neurology

## 2022-06-30 ENCOUNTER — Other Ambulatory Visit: Payer: Self-pay | Admitting: Neurology

## 2022-07-03 ENCOUNTER — Other Ambulatory Visit (HOSPITAL_COMMUNITY): Payer: Self-pay

## 2022-07-03 ENCOUNTER — Telehealth: Payer: Self-pay | Admitting: Pharmacy Technician

## 2022-07-03 NOTE — Telephone Encounter (Signed)
Patient Advocate Encounter  Prior Authorization for NURTEC 75MG  has been approved with [employer].    PA# [id number] Effective dates: 6.24.24 through 6.24.25  Per [employer] test claim, copay for 30 days supply is $19.97 w/eVoucher.

## 2022-07-03 NOTE — Telephone Encounter (Signed)
Patient Advocate Encounter  Prior Authorization for NURTEC 75MG  has been approved with [employer].    PA# [id number] Effective dates: 6.24.24 through 6.24.25  Per [employer] test claim, UNABLE TO FILL AGAIN ON INSURANCE UNTIL 6.30.24.

## 2022-07-03 NOTE — Telephone Encounter (Signed)
LMOVM

## 2022-07-03 NOTE — Telephone Encounter (Signed)
Patient Advocate Encounter  Received notification from [employer] that prior authorization for NURTEC 75MG  is required.   PA submitted on 6.24.24 Key [id number] Status is pending     Received notification from [facility] that prior authorization for NURTEC 75MG  is required.   PA submitted on 6.24.24 Key [id number] Status is pending

## 2022-07-03 NOTE — Telephone Encounter (Signed)
Pt does have two insurance's. PA was submitted for both. So far only one has returned as APPROVED, but next earliest fill will not go through until 6.30.24.

## 2022-10-26 NOTE — Progress Notes (Signed)
NEUROLOGY FOLLOW UP OFFICE NOTE  KANON THRALL [id number]  Assessment/Plan:   Migraine without aura, without status migrainosus, not intractable Hemiplegic migraine   Migraine prevention: Switch from Nurtec to Qulipta 60mg  daily Migraine rescue:  Excedrin   Limit use of pain relievers to no more than 2 days out of week to prevent risk of rebound or medication-overuse headache. Keep headache diary Follow up 5 months.     Subjective:  Jackie [name] is a 32 year old female with hypothyroidism and GERD who follows up for migraines.   UPDATE: In June, we switched from Aimovig to Nurtec.    Headache frequency is worse. Intensity:  severe Duration:  within 2 hours with Excedrin Frequency: headache every other day (2 days were severe, however no hemiplegic migraines)   Current NSAIDS/analgesics:  Excedrin Current triptans:  none Current ergotamine:  none Current anti-emetic:  none Current muscle relaxants:  none Current Antihypertensive medications:  none Current Antidepressant medications:  none Current Anticonvulsant medications:  none Current anti-CGRP:  Nurtec 75mg  every other day Current Vitamins/Herbal/Supplements:  none Current Antihistamines/Decongestants:  none Other therapy:  absent Hormone/birth control:  absent   Caffeine:  coffee no more than once a week. Alcohol:  occasional Smoker:  no Diet:  Drinks a lot of water. Does not skip meals Exercise:  not routine.  Some walks Depression:  no; Anxiety:  no Other pain:  some low back pain (DDD) Sleep hygiene:  good   HISTORY:  Onset:  32 years old Location:  varies - usually at crown or down left side of temple and face Quality:  usually squeezing but also stabbing Intensity:  usually 6-7/10, 10/10 at its worse Aura:  none Prodrome:  absent Associated symptoms:  Nausea, photophobia, phonophobia, Vomiting if severe.  She denies associated visual disturbance, unilateral numbness or weakness. Duration:  may  last 1 day to several days Frequency:  once every other week Frequency of abortive medication: Tylenol daily Triggers:  unknown Relieving factors:  sometimes Tylenol Activity:  affected if severe   She has a baseline dull persistent headache "my whole life"   She has past history of hemiplegic migraine without headache.  Left sided numbness and paralysis (5 minutes paralysis but another 15-20 minutes with numbness).  They used to occur often.  They resolved after she became pregnant in 2018.  It appears that when her classic migraines are well controlled, she has increased frequency of her hemiplegic migraines.  Due to increased frequency of hemiplegic migraines, she underwent MRI of brain with and without contrast on 12/31/2021, which exhibited some artifact on the susceptibility-weighted sequence but overall unremarkable.  Routine EEG on 12/13/2021 was normal.        Past NSAIDS/analgesics:  naproxen, ibuprofen, acetaminophen Past abortive triptans:  rizatriptan, sumatriptan tab, eletriptan, zolmitriptan Past abortive ergotamine:  none Past muscle relaxants:  cylcobenzaprine Past anti-emetic:  promethazine, ondansetron Past antihypertensive medications:  propranolol Past antidepressant medications:  amitriptyline, imipramine Past anticonvulsant medications:  topiramate (severe side effects - "brain swelling"), zonisamide Past anti-CGRP:  Ubrelvy (ineffective), Aimovig  Past vitamins/Herbal/Supplements:  none Past antihistamines/decongestants:  none Other past therapies:  none  PAST MEDICAL HISTORY: Past Medical History:  Diagnosis Date   Chondromalacia of right patella 10/2013   Compartment syndrome, nontraumatic, lower extremity 10/2013   right   GERD (gastroesophageal reflux disease)    Hypothyroidism    Migraines    Retroversion, uterus     MEDICATIONS: Current Outpatient Medications on File Prior to Visit  Medication Sig Dispense Refill   acetaminophen (TYLENOL) 500 MG  tablet Take 1,000 mg by mouth every 6 (six) hours as needed for headache.     aspirin-acetaminophen-caffeine (EXCEDRIN MIGRAINE) 250-250-65 MG tablet Take by mouth every 6 (six) hours as needed for headache.     NURTEC 75 MG TBDP TAKE 1 TABLET (75 MG TOTAL) BY MOUTH EVERY OTHER DAY. 16 tablet 11   No current facility-administered medications on file prior to visit.    ALLERGIES: Allergies  Allergen Reactions   Topamax [Topiramate] Other (See Comments)    NUMBNESS OF HANDS AND FACE    FAMILY HISTORY: Family History  Problem Relation Age of Onset   Thyroid disease Mother    Diabetes Paternal Grandmother    Migraines Father       Objective:  Blood pressure 117/69, pulse 78, height 5\' 7"  (1.702 m), weight 180 lb (81.6 kg), SpO2 97%, unknown if currently breastfeeding. General: No acute distress.  Patient appears well-groomed.      Shon Millet, DO

## 2022-10-30 ENCOUNTER — Encounter: Payer: Self-pay | Admitting: Neurology

## 2022-10-30 ENCOUNTER — Ambulatory Visit (INDEPENDENT_AMBULATORY_CARE_PROVIDER_SITE_OTHER): Payer: 59 | Admitting: Neurology

## 2022-10-30 VITALS — BP 117/69 | HR 78 | Ht 67.0 in | Wt 180.0 lb

## 2022-10-30 DIAGNOSIS — G43709 Chronic migraine without aura, not intractable, without status migrainosus: Secondary | ICD-10-CM | POA: Diagnosis not present

## 2022-10-30 DIAGNOSIS — G43409 Hemiplegic migraine, not intractable, without status migrainosus: Secondary | ICD-10-CM | POA: Diagnosis not present

## 2022-10-30 MED ORDER — QULIPTA 60 MG PO TABS: 60.0000 mg | ORAL_TABLET | Freq: Every day | ORAL | 11 refills | Status: DC

## 2022-10-30 NOTE — Patient Instructions (Addendum)
Help finding a primary care provider within [place]. If you do not have access to a computer or the Internet you can call [phone] and  they will help you scheduled with a provider.  Or you can go to: [email or web address]    STOP NURTEC.  START QULIPTA 60MG  DAILY EXCEDRIN AS NEEDED.  Limit use of pain relievers to no more than 2 days out of week to prevent risk of rebound or medication-overuse headache. FOLLOW UP 5 MONTHS.

## 2022-12-19 ENCOUNTER — Other Ambulatory Visit: Payer: Self-pay | Admitting: Neurology

## 2022-12-22 ENCOUNTER — Other Ambulatory Visit (HOSPITAL_COMMUNITY): Payer: Self-pay

## 2022-12-22 ENCOUNTER — Telehealth: Payer: Self-pay | Admitting: Pharmacy Technician

## 2022-12-22 NOTE — Telephone Encounter (Addendum)
Pharmacy Patient Advocate Encounter   Received notification from RX Request Messages that prior authorization for QULIPTA 60MG  is required/requested.   Insurance verification completed.   The patient is insured through [employer] .  ZOX:[id number] - NO PA NEEDED   Per test claim: The current 90 day co-pay is, $0.  No PA needed at this time. This test claim was processed through [facility]- copay amounts may vary at other pharmacies due to pharmacy/plan contracts, or as the patient moves through the different stages of their insurance plan.

## 2022-12-22 NOTE — Telephone Encounter (Signed)
Please see 12.13.24 encounter

## 2022-12-25 ENCOUNTER — Other Ambulatory Visit (HOSPITAL_COMMUNITY): Payer: Self-pay

## 2022-12-25 MED ORDER — QULIPTA 60 MG PO TABS: 1.0000 | ORAL_TABLET | Freq: Every day | ORAL | 1 refills | Status: AC

## 2022-12-25 NOTE — Addendum Note (Signed)
Addended by: Leida Lauth on: 12/25/2022 10:01 AM   Modules accepted: Orders

## 2022-12-25 NOTE — Telephone Encounter (Signed)
Spoke to patient okay to send [place] [facility].  Patient agreed to have the 90 day supply of [facility]ipta [facility].    Patient states she received a letter from her insurance company they will allow a temporary supply but she needs a alter

## 2022-12-25 NOTE — Telephone Encounter (Signed)
LMOVM for patient to call the office back.

## 2022-12-27 ENCOUNTER — Other Ambulatory Visit (HOSPITAL_COMMUNITY): Payer: Self-pay

## 2022-12-28 NOTE — Telephone Encounter (Signed)
LMOVM for patient to call the office back.  I just called [facility] and was told this patient has a designated plan and would need to call the number on the back of the card for further assistance. There is not a card on file for me to call to get additional information of where to send the script. Patient would need to call the number on the back of her prescription/pharmacy benefit card and provide the information on where to send the script for future fills

## 2023-01-22 ENCOUNTER — Telehealth: Payer: Self-pay

## 2023-01-22 ENCOUNTER — Encounter: Payer: Self-pay | Admitting: Neurology

## 2023-01-22 NOTE — Telephone Encounter (Signed)
 Medication Samples have been provided to the patient.  Drug name: Qulipta        Strength: 100 mg        Qty: 8   LOT: [id number]  Exp.Date: 9/26  Dosing instructions: daily  The patient has been instructed regarding the correct time, dose, and frequency of taking this medication, including desired effects and most common side effects.   Jackie [name] 10:51 AM 01/22/2023

## 2023-02-12 ENCOUNTER — Telehealth: Payer: Self-pay

## 2023-02-12 ENCOUNTER — Other Ambulatory Visit (HOSPITAL_COMMUNITY): Payer: Self-pay

## 2023-02-12 NOTE — Telephone Encounter (Signed)
PA request has been Submitted. New Encounter created for follow up. For additional info see Pharmacy Prior Auth telephone encounter from 02/03.

## 2023-02-12 NOTE — Telephone Encounter (Signed)
*  [facility]  Pharmacy Patient Advocate Encounter   Received notification from Patient Advice Request messages that prior authorization for Qulipta 60MG  tablets  is required/requested.   Insurance verification completed.   The patient is insured through [facility] .   Per test claim: PA required; PA submitted to above mentioned insurance via CoverMyMeds Key/confirmation #/EOC [id number] Status is pending   Patient states this plan is primary- test claim through "secondary" shows refill too soon, last filled 01/24/2023  next fill due 02/19/2023

## 2023-02-27 ENCOUNTER — Other Ambulatory Visit (HOSPITAL_COMMUNITY): Payer: Self-pay

## 2023-02-27 NOTE — Telephone Encounter (Signed)
 Pharmacy Patient Advocate Encounter  Received notification from [facility] that Prior Authorization for Qulipta 60MG  tablets  has been APPROVED from 02/12/2023 to 02/12/2024. Ran test claim, Copay is $0.00. This test claim was processed through [facility]- copay amounts may vary at other pharmacies due to pharmacy/plan contracts, or as the patient moves through the different stages of their insurance plan.

## 2023-04-02 NOTE — Progress Notes (Unsigned)
 NEUROLOGY FOLLOW UP OFFICE NOTE  ROSELL KHOURI [id number]  Assessment/Plan:   Migraine without aura, without status migrainosus, not intractable Hemiplegic migraine   Migraine prevention: Qulipta 60mg  daily *** Migraine rescue:  Excedrin   *** Limit use of pain relievers to no more than 2 days out of week to prevent risk of rebound or medication-overuse headache. Keep headache diary Follow up 6 months ***     Subjective:  Jackie [name] is a 33 year old female with hypothyroidism and GERD who follows up for migraines.   UPDATE: Switched from [employer]	[place] to [place]. ***  Intensity:  severe Duration:  within 2 hours with Excedrin Frequency: headache every other day (2 days were severe, however no hemiplegic migraines)   Current NSAIDS/analgesics:  Excedrin Current triptans:  none Current ergotamine:  none Current anti-emetic:  none Current muscle relaxants:  none Current Antihypertensive medications:  none Current Antidepressant medications:  none Current Anticonvulsant medications:  none Current anti-CGRP:  Qulipta 60mg  daily Current Vitamins/Herbal/Supplements:  none Current Antihistamines/Decongestants:  none Other therapy:  absent Hormone/birth control:  absent   Caffeine:  coffee no more than once a week. Alcohol:  occasional Smoker:  no Diet:  Drinks a lot of water. Does not skip meals Exercise:  not routine.  Some walks Depression:  no; Anxiety:  no Other pain:  some low back pain (DDD) Sleep hygiene:  good   HISTORY:  Onset:  33 years old Location:  varies - usually at crown or down left side of temple and face Quality:  usually squeezing but also stabbing Intensity:  usually 6-7/10, 10/10 at its worse Aura:  none Prodrome:  absent Associated symptoms:  Nausea, photophobia, phonophobia, Vomiting if severe.  She denies associated visual disturbance, unilateral numbness or weakness. Duration:  may last 1 day to several days Frequency:  once every other  week Frequency of abortive medication: Tylenol daily Triggers:  unknown Relieving factors:  sometimes Tylenol Activity:  affected if severe   She has a baseline dull persistent headache "my whole life"   She has past history of hemiplegic migraine without headache.  Left sided numbness and paralysis (5 minutes paralysis but another 15-20 minutes with numbness).  They used to occur often.  They resolved after she became pregnant in 2018.  It appears that when her classic migraines are well controlled, she has increased frequency of her hemiplegic migraines.  Due to increased frequency of hemiplegic migraines, she underwent MRI of brain with and without contrast on 12/31/2021, which exhibited some artifact on the susceptibility-weighted sequence but overall unremarkable.  Routine EEG on 12/13/2021 was normal.        Past NSAIDS/analgesics:  naproxen, ibuprofen, acetaminophen Past abortive triptans:  rizatriptan, sumatriptan tab, eletriptan, zolmitriptan Past abortive ergotamine:  none Past muscle relaxants:  cylcobenzaprine Past anti-emetic:  promethazine, ondansetron Past antihypertensive medications:  propranolol Past antidepressant medications:  amitriptyline, imipramine Past anticonvulsant medications:  topiramate (severe side effects - "brain swelling"), zonisamide Past anti-CGRP:  Ubrelvy (ineffective), Aimovig, Nurtec 75mg  every other day Past vitamins/Herbal/Supplements:  none Past antihistamines/decongestants:  none Other past therapies:  none  PAST MEDICAL HISTORY: Past Medical History:  Diagnosis Date   Chondromalacia of right patella 10/2013   Compartment syndrome, nontraumatic, lower extremity 10/2013   right   GERD (gastroesophageal reflux disease)    Hypothyroidism    Migraines    Retroversion, uterus     MEDICATIONS: Current Outpatient Medications on File Prior to Visit  Medication Sig Dispense Refill   acetaminophen (  TYLENOL) 500 MG tablet Take 1,000 mg by mouth  every 6 (six) hours as needed for headache.     aspirin-acetaminophen-caffeine (EXCEDRIN MIGRAINE) 250-250-65 MG tablet Take by mouth every 6 (six) hours as needed for headache.     Atogepant (QULIPTA) 60 MG TABS Take 1 tablet (60 mg total) by mouth daily. 90 tablet 1   No current facility-administered medications on file prior to visit.    ALLERGIES: Allergies  Allergen Reactions   Topamax [Topiramate] Other (See Comments)    NUMBNESS OF HANDS AND FACE    FAMILY HISTORY: Family History  Problem Relation Age of Onset   Thyroid disease Mother    Diabetes Paternal Grandmother    Migraines Father       Objective:  *** General: No acute distress.  Patient appears well-groomed.   Head:  Normocephalic/atraumatic Neck:  Supple.  No paraspinal tenderness.  Full range of motion. Heart:  Regular rate and rhythm. Neuro:  Alert and oriented.  Speech fluent and not dysarthric.  Language intact.  CN II-XII intact.  Bulk and tone normal.  Muscle strength 5/5 throughout.  Deep tendon reflexes 2+ throughout.  Gait normal.  Romberg negative.    Shon Millet, DO

## 2023-04-03 ENCOUNTER — Encounter: Payer: Self-pay | Admitting: Neurology

## 2023-04-03 ENCOUNTER — Ambulatory Visit (INDEPENDENT_AMBULATORY_CARE_PROVIDER_SITE_OTHER): Payer: 59 | Admitting: Neurology

## 2023-04-03 VITALS — BP 128/82 | HR 95 | Resp 18

## 2023-04-03 DIAGNOSIS — G43709 Chronic migraine without aura, not intractable, without status migrainosus: Secondary | ICD-10-CM

## 2023-04-03 NOTE — Patient Instructions (Signed)
 Plan to start Botox Limit use of pain relievers to no more than 9 days out of the month to prevent risk of rebound or medication-overuse headache. Keep headache diary

## 2023-04-06 ENCOUNTER — Telehealth: Payer: Self-pay

## 2023-04-06 NOTE — Telephone Encounter (Signed)
 Sent Botox new start to prior authorization.

## 2023-04-11 ENCOUNTER — Other Ambulatory Visit (HOSPITAL_COMMUNITY): Payer: Self-pay

## 2023-04-12 ENCOUNTER — Telehealth: Payer: Self-pay | Admitting: Pharmacy Technician

## 2023-04-12 ENCOUNTER — Other Ambulatory Visit (HOSPITAL_COMMUNITY): Payer: Self-pay

## 2023-04-12 NOTE — Telephone Encounter (Signed)
 Pharmacy Patient Advocate Encounter   BotoxOne verification has been Submitted Benefit Verification #:  [id number]   Dx Code: G43. J-code: [id number] Procedure code: 86578

## 2023-04-12 NOTE — Telephone Encounter (Signed)
 PA has been submitted, and telephone encounter has been created. Please see telephone encounter dated 4.3.25.

## 2023-04-12 NOTE — Telephone Encounter (Signed)
 Pharmacy Benefit PA has been submitted for Botox via CoverMyMeds.  INSURANCE: [employer] KEY/EOC/FAX: [id number] Status is Pending

## 2023-04-12 NOTE — Telephone Encounter (Signed)
 Pharmacy Benefit PA has been submitted for Botox via CoverMyMeds.  INSURANCE: [employer] KEY/EOC/FAXInocente Salles Status is Pending

## 2023-04-13 ENCOUNTER — Encounter: Payer: Self-pay | Admitting: Neurology

## 2023-04-16 ENCOUNTER — Other Ambulatory Visit (HOSPITAL_COMMUNITY): Payer: Self-pay

## 2023-04-16 NOTE — Telephone Encounter (Signed)
 Pharmacy Patient Advocate Encounter- Botox BIV-Pharmacy Benefit:  PA was submitted to [facility] and has been approved through: 4.4.25 TO 10.4.25 Authorization# [id number]  Please send prescription to Specialty Pharmacy: [facility]: [phone] Estimated Copay is: ?  Patient IS eligible for Botox Copay Card, which will make patient's copay as little as zero. Copay card will be provided to pharmacy.

## 2023-04-17 MED ORDER — ONABOTULINUMTOXINA 200 UNITS IJ SOLR: INTRAMUSCULAR | 4 refills | Status: AC

## 2023-04-19 NOTE — Telephone Encounter (Signed)
 Pharmacy Patient Advocate Encounter   BotoxOne verification has been Completed Benefit Verification #:  [id number]   Dx Code: G43.709 J-code: Z6109 Procedure code: 60454

## 2023-04-27 ENCOUNTER — Encounter: Payer: Self-pay | Admitting: Neurology

## 2023-04-30 ENCOUNTER — Other Ambulatory Visit (HOSPITAL_COMMUNITY): Payer: Self-pay

## 2023-04-30 NOTE — Telephone Encounter (Signed)
 Pharmacy Patient Advocate Encounter  Received notification from [employer] that Prior Authorization for BOTOX 200 has been DENIED.  Full denial letter will be uploaded to the media tab. See denial reason below.   PA #/Case ID/Reference #: [id number]

## 2023-05-02 ENCOUNTER — Other Ambulatory Visit (HOSPITAL_COMMUNITY): Payer: Self-pay

## 2023-05-04 NOTE — Telephone Encounter (Signed)
 If her insurance allows it then yes. Pharmacy would just need to process the one insurance and then the copay card.

## 2023-05-14 ENCOUNTER — Other Ambulatory Visit (HOSPITAL_COMMUNITY): Payer: Self-pay

## 2023-05-14 NOTE — Telephone Encounter (Signed)
 Called and verified with [employer]. Authorization is not through pharmacy benefits, it is Research officer, political party. Not approved under pharmacy benenfits.

## 2023-05-14 NOTE — Telephone Encounter (Signed)
 LMOVM for patient to call the office back.

## 2023-05-14 NOTE — Telephone Encounter (Addendum)
 Spoke to rep Thersese at [facility], Per their PA investigation they are not in network with the patient plan as well as the copay card only good for three days. Patient copay would be $1060.   LMOVM for patient to cancel appt and reschedule once we find out what happened.

## 2023-05-18 ENCOUNTER — Ambulatory Visit: Admitting: Neurology
